# Patient Record
Sex: Female | Born: 1955 | Race: White | Hispanic: No | Marital: Married | Smoking: Former smoker
Health system: Southern US, Community
[De-identification: ages and names within clinical notes are randomized; demographics above are authoritative.]

## PROBLEM LIST (undated history)

## (undated) DIAGNOSIS — J302 Other seasonal allergic rhinitis: Secondary | ICD-10-CM

## (undated) DIAGNOSIS — C3490 Malignant neoplasm of unspecified part of unspecified bronchus or lung: Secondary | ICD-10-CM

## (undated) DIAGNOSIS — J939 Pneumothorax, unspecified: Secondary | ICD-10-CM

## (undated) DIAGNOSIS — M1990 Unspecified osteoarthritis, unspecified site: Secondary | ICD-10-CM

## (undated) HISTORY — PX: ORIF ANKLE FRACTURE: SUR919

## (undated) HISTORY — PX: SPINE SURGERY: SHX786

## (undated) HISTORY — DX: Pneumothorax, unspecified: J93.9

## (undated) HISTORY — PX: TUBAL LIGATION: SHX77

## (undated) HISTORY — DX: Unspecified osteoarthritis, unspecified site: M19.90

## (undated) HISTORY — DX: Other seasonal allergic rhinitis: J30.2

---

## 2015-01-03 ENCOUNTER — Other Ambulatory Visit: Payer: Self-pay | Admitting: *Deleted

## 2015-01-03 ENCOUNTER — Encounter: Payer: Self-pay | Admitting: *Deleted

## 2015-01-03 DIAGNOSIS — R918 Other nonspecific abnormal finding of lung field: Secondary | ICD-10-CM | POA: Insufficient documentation

## 2015-01-03 NOTE — [facility] Oncology Navigator Note (Unsigned)
I received a phone call from Judith Gilbert son requesting an appt with Dr. Julien Nordmann for a second opinion.  I received records on 12/31/14.  I reviewed and called patient with appt.  She verbalized understanding of appt time and place.

## 2015-01-11 ENCOUNTER — Other Ambulatory Visit: Payer: Self-pay | Admitting: Internal Medicine

## 2015-01-12 ENCOUNTER — Other Ambulatory Visit (HOSPITAL_BASED_OUTPATIENT_CLINIC_OR_DEPARTMENT_OTHER): Payer: BLUE CROSS/BLUE SHIELD

## 2015-01-12 ENCOUNTER — Ambulatory Visit (HOSPITAL_BASED_OUTPATIENT_CLINIC_OR_DEPARTMENT_OTHER): Payer: BLUE CROSS/BLUE SHIELD | Admitting: Internal Medicine

## 2015-01-12 ENCOUNTER — Telehealth: Payer: Self-pay | Admitting: *Deleted

## 2015-01-12 ENCOUNTER — Encounter: Payer: Self-pay | Admitting: Internal Medicine

## 2015-01-12 ENCOUNTER — Telehealth: Payer: Self-pay | Admitting: Medical Oncology

## 2015-01-12 ENCOUNTER — Telehealth: Payer: Self-pay | Admitting: Internal Medicine

## 2015-01-12 VITALS — BP 120/67 | HR 83 | Temp 98.4°F | Resp 18 | Ht 65.0 in | Wt 174.3 lb

## 2015-01-12 DIAGNOSIS — C3491 Malignant neoplasm of unspecified part of right bronchus or lung: Secondary | ICD-10-CM

## 2015-01-12 DIAGNOSIS — C3411 Malignant neoplasm of upper lobe, right bronchus or lung: Secondary | ICD-10-CM

## 2015-01-12 DIAGNOSIS — R0609 Other forms of dyspnea: Secondary | ICD-10-CM

## 2015-01-12 DIAGNOSIS — M25511 Pain in right shoulder: Secondary | ICD-10-CM

## 2015-01-12 DIAGNOSIS — R918 Other nonspecific abnormal finding of lung field: Secondary | ICD-10-CM

## 2015-01-12 LAB — COMPREHENSIVE METABOLIC PANEL (CC13)
ALT: 10 U/L (ref 0–55)
ANION GAP: 13 meq/L — AB (ref 3–11)
AST: 13 U/L (ref 5–34)
Albumin: 3.5 g/dL (ref 3.5–5.0)
Alkaline Phosphatase: 125 U/L (ref 40–150)
BILIRUBIN TOTAL: 0.23 mg/dL (ref 0.20–1.20)
BUN: 8.8 mg/dL (ref 7.0–26.0)
CO2: 27 mEq/L (ref 22–29)
CREATININE: 0.8 mg/dL (ref 0.6–1.1)
Calcium: 9.4 mg/dL (ref 8.4–10.4)
Chloride: 104 mEq/L (ref 98–109)
EGFR: 85 mL/min/{1.73_m2} — ABNORMAL LOW (ref >90)
Glucose: 88 mg/dl (ref 70–140)
Potassium: 4 mEq/L (ref 3.5–5.1)
Sodium: 144 mEq/L (ref 136–145)
Total Protein: 7.6 g/dL (ref 6.4–8.3)

## 2015-01-12 LAB — CBC WITH DIFFERENTIAL/PLATELET
BASO%: 0.6 % (ref 0.0–2.0)
Basophils Absolute: 0 10*3/uL (ref 0.0–0.1)
EOS ABS: 0 10*3/uL (ref 0.0–0.5)
EOS%: 1.1 % (ref 0.0–7.0)
HCT: 33.3 % — ABNORMAL LOW (ref 34.8–46.6)
HGB: 10.8 g/dL — ABNORMAL LOW (ref 11.6–15.9)
LYMPH#: 0.6 10*3/uL — AB (ref 0.9–3.3)
LYMPH%: 15.6 % (ref 14.0–49.7)
MCH: 28.6 pg (ref 25.1–34.0)
MCHC: 32.5 g/dL (ref 31.5–36.0)
MCV: 87.9 fL (ref 79.5–101.0)
MONO#: 0.4 10*3/uL (ref 0.1–0.9)
MONO%: 10.6 % (ref 0.0–14.0)
NEUT%: 72.1 % (ref 38.4–76.8)
NEUTROS ABS: 2.9 10*3/uL (ref 1.5–6.5)
PLATELETS: 345 10*3/uL (ref 145–400)
RBC: 3.79 10*6/uL (ref 3.70–5.45)
RDW: 20.6 % — ABNORMAL HIGH (ref 11.2–14.5)
WBC: 4 10*3/uL (ref 3.9–10.3)

## 2015-01-12 MED ORDER — CYANOCOBALAMIN 1000 MCG/ML IJ SOLN
INTRAMUSCULAR | Status: AC
  Filled 2015-01-12: qty 1

## 2015-01-12 MED ORDER — FOLIC ACID 1 MG PO TABS: 1.0000 mg | ORAL_TABLET | Freq: Every day | ORAL | Status: DC

## 2015-01-12 MED ORDER — CYANOCOBALAMIN 1000 MCG/ML IJ SOLN
1000.0000 ug | Freq: Once | INTRAMUSCULAR | Status: AC
  Administered 2015-01-12: 1000 ug via INTRAMUSCULAR

## 2015-01-12 MED ORDER — DEXAMETHASONE 4 MG PO TABS: ORAL_TABLET | ORAL | Status: DC

## 2015-01-12 MED ORDER — PROCHLORPERAZINE MALEATE 10 MG PO TABS: 10.0000 mg | ORAL_TABLET | Freq: Four times a day (QID) | ORAL | Status: DC | PRN

## 2015-01-12 NOTE — Telephone Encounter (Signed)
Per staff message and POF I have scheduled appts. Advised scheduler of appts. JMW  

## 2015-01-12 NOTE — Telephone Encounter (Signed)
erronoeus

## 2015-01-12 NOTE — Progress Notes (Signed)
[facility] Telephone:([phone]   Fax:([phone]) [phone]  CONSULT NOTE  REFERRING PHYSICIAN: Dr. Debbe Bales  REASON FOR CONSULTATION:  59 years old white female recently diagnosed with lung cancer.  HPI Judith Gilbert is a 59 y.o. female a never smoker from [place], [place] with no significant past medical history except for bone fractures surgery after an accident several years ago. The patient mentioned that in October 2015 she has been complaining of pain on the right shoulder and right scapula. She was seen by her orthopedic surgeon and she had MRI of the cervical spine performed on 07/30/2014 and it showed a finding worrisome for a mass in the lung apex. This was followed by CT scan of the chest as well as PET scan on 08/27/2014 and it showed hypermetabolic mass in the right apex with invasion of the mediastinum consistent with malignancy in addition to hypermetabolic right paratracheal lymph node also consistent with malignancy. The previously noted right hilar lymph nodes demonstrate no suspicious activity. There was no abnormal hypermetabolic adenopathy in the contralateral mediastinum or hilum. There was no suspicious activity throughout the neck, abdomen or pelvis and no suspicious osseous activity. The patient initially underwent bronchoscopy with transbronchial fine needle aspiration of the 4R lymph node as well as the 10 R lymph node which showed atypical cells suspicious for adenocarcinoma. The patient then underwent mediastinoscopy by Dr. Juleen China and the biopsy of the 4R lymph node was positive for adenocarcinoma but level 7 lymph node was negative. The tissue blocks were sent for routine molecular studies and it showed negative EGFR mutation, negative ALK gene translocation as well as negative ROS 1.  MRI of the brain showed no evidence for metastatic disease to the brain. The patient was seen by Dr. Ovid Gilbert, medical oncology as well as Dr. Avis Epley, radiation oncology.  She was started on a course of concurrent chemoradiation with weekly carboplatin and paclitaxel for 6 weeks and total dose of radiation 60 GY completed the first week of February 2016. The patient tolerated her treatment fairly well. She had repeat CT scan of the chest performed on 12/20/2014 which showed stable 2.0 CML enlarged right paratracheal lymph node and a stable right apical mass measuring 4.2 x 5.6 x 4.1 cm with no significant change from the prior study. There was no new lesions or other enlarged mediastinal lymph nodes. The patient is currently monitored by observation and expected to have repeat CT scan of the chest on 02/28/2015. She did not receive any consolidation chemotherapy. She came today for evaluation and second opinion regarding her condition.  She is feeling fine today with no specific complaints except for mild right shoulder pain which has significantly improved compared after the course of concurrent chemoradiation. She has shortness breath with exertion. She denied having any significant cough or hemoptysis. The patient denied having any significant weight loss or night sweats. She has no nausea or vomiting or change in her bowel movement.  PAST MEDICAL HISTORY: Significant only for recently diagnosed stage IIIB non-small cell lung cancer, adenocarcinoma, history of bone surgery after accident, total abdominal hysterectomy. The patient denied having any history of diabetes mellitus, hypertension, coronary R disease or stroke.   FAMILY HISTORY: significant for mother with esophageal cancer and father died from alcoholic complications.   SOCIAL HISTORY: The patient is married and has 3 children. She was accompanied today by her husband Judith Gilbert. She is currently retired. She is almost a never smoker as the patient smoked only one  pack of cigarette for 3 years and quit in 1981. No history of alcohol or drug abuse.   HPI  Social History History  Substance Use Topics  . Smoking  status: Former Smoker -- 1.00 packs/day for 4 years    Quit date: 01/12/1980  . Smokeless tobacco: Not on file  . Alcohol Use: Not on file    Allergies  Allergen Reactions  . Percocet [Oxycodone-Acetaminophen] Rash    Current Outpatient Prescriptions  Medication Sig Dispense Refill  . montelukast (SINGULAIR) 10 MG tablet Take 10 mg by mouth at bedtime.    Marland Kitchen venlafaxine (EFFEXOR) 75 MG tablet Take 75 mg by mouth daily.    Marland Kitchen dexamethasone (DECADRON) 4 MG tablet 4 mg by mouth twice a day the day before, day of and day after the chemotherapy every 3 weeks. 40 tablet 0  . folic acid (FOLVITE) 1 MG tablet Take 1 tablet (1 mg total) by mouth daily. 30 tablet 2  . prochlorperazine (COMPAZINE) 10 MG tablet Take 1 tablet (10 mg total) by mouth every 6 (six) hours as needed for nausea or vomiting. 30 tablet 0   No current facility-administered medications for this visit.    Review of Systems  Constitutional: negative Eyes: negative Ears, nose, mouth, throat, and face: negative Respiratory: positive for dyspnea on exertion Cardiovascular: negative Gastrointestinal: negative Genitourinary:negative Integument/breast: negative Hematologic/lymphatic: negative Musculoskeletal:positive for Mild right shoulder pain. Neurological: negative Behavioral/Psych: negative Endocrine: negative Allergic/Immunologic: negative  Physical Exam  WKM:QKMMN, healthy, no distress, well nourished and well developed SKIN: skin color, texture, turgor are normal, no rashes or significant lesions HEAD: Normocephalic, No masses, lesions, tenderness or abnormalities EYES: normal, PERRLA EARS: External ears normal, Canals clear OROPHARYNX:no exudate, no erythema and lips, buccal mucosa, and tongue normal  NECK: supple, no adenopathy, no JVD LYMPH:  no palpable lymphadenopathy, no hepatosplenomegaly BREAST:not examined LUNGS: clear to auscultation , and palpation HEART: regular rate & rhythm, no murmurs and no  gallops ABDOMEN:abdomen soft, non-tender, normal bowel sounds and no masses or organomegaly BACK: Back symmetric, no curvature., No CVA tenderness EXTREMITIES:no joint deformities, effusion, or inflammation, no edema, no skin discoloration, no clubbing  NEURO: alert & oriented x 3 with fluent speech, no focal motor/sensory deficits  PERFORMANCE STATUS: ECOG 1  LABORATORY DATA: Lab Results  Component Value Date   WBC 4.0 01/12/2015   HGB 10.8* 01/12/2015   HCT 33.3* 01/12/2015   MCV 87.9 01/12/2015   PLT 345 01/12/2015      Chemistry      Component Value Date/Time   NA 144 01/12/2015 1119   K 4.0 01/12/2015 1119   CO2 27 01/12/2015 1119   BUN 8.8 01/12/2015 1119   CREATININE 0.8 01/12/2015 1119      Component Value Date/Time   CALCIUM 9.4 01/12/2015 1119   ALKPHOS 125 01/12/2015 1119   AST 13 01/12/2015 1119   ALT 10 01/12/2015 1119   BILITOT 0.23 01/12/2015 1119       RADIOGRAPHIC STUDIES: No results found.  ASSESSMENT: This is a very pleasant 59 years old never smoker white female recently diagnosed with a stage IIIB (T4, N2, M0) non-small cell lung cancer with negative EGFR mutation, negative ALK gene translocation and negative ROS 1 gene rearrangement diagnosed in November 2015. The patient is status post a course of concurrent chemoradiation with weekly carboplatin and paclitaxel with no significant change in her disease on his recent imaging studies.   PLAN: I had a lengthy discussion with the patient and her husband  today about her current disease stage, prognosis and treatment options. I explained to the patient and her husband that she received to standard treatment for her current stage of the disease with a course of concurrent chemoradiation. I also explained to the patient and her husband that the only change I would offer at this point is consideration of 3 cycles of consolidation chemotherapy because of the high risk of systemic disease recurrence in  patient with a stage IIIB especially if the concurrent chemoradiation was with weekly carboplatin and paclitaxel based on a recent ECOG clinical trial which showed survival benefit for concurrent chemoradiation with weekly carboplatin and paclitaxel followed by 2-3 cycles of consolidation chemotherapy. The patient and her husband are interested in considering the course of consolidation chemotherapy. I recommended for her a regimen consisting of carboplatin for AUC of 5 and Alimta 500 MG/M2 every 3 weeks for 3 cycles. I discussed with the patient and her husband the adverse effect of this treatment including but not limited to mild alopecia, myelosuppression, nausea and vomiting, peripheral neuropathy, liver or renal dysfunction. The patient is expected to start the first cycle of her systemic chemotherapy in 2 weeks. She'll receive vitamin B 12 injection today. I will also call her pharmacy with prescription for Compazine 10 mg by mouth every 6 hours as needed for nausea, folic acid 1 mg by mouth daily in addition to Decadron 4 mg by mouth twice a day the day before, day of and day after the chemotherapy every 3 weeks. I will repeat a PET scan and he is in 2 weeks for reevaluation of her disease before starting the first cycle of consolidation chemotherapy. I also discussed with the patient sending her tissue block to [facility] for extensive molecular studies especially with her non-smoker status as her tumor may harbor other mutation-like be BRAF, RET, MET or ERBB2. which are potential target for treatment if the patient has evidence for disease progression. She will discuss with Dr. Ovid Gilbert sending the tissue block for the second generation sequencing. If there is insufficient tissue, I may consider sending the blood test to [facility] for molecular study. The patient would come back for follow-up visit in 2 weeks for reevaluation before starting the first cycle of her treatment. She was advised  to call immediately if she has any concerning symptoms in the interval. The patient voices understanding of current disease status and treatment options and is in agreement with the current care plan.  All questions were answered. The patient knows to call the clinic with any problems, questions or concerns. We can certainly see the patient much sooner if necessary.  Thank you so much for allowing me to participate in the care of [employer] D[employer]. I will continue to follow up the patient with you and assist in her care.  I spent 55 minutes counseling the patient face to face. The total time spent in the appointment was 80 minutes.  Disclaimer: This note was dictated with voice recognition software. Similar sounding words can inadvertently be transcribed and may not be corrected upon review.   Kirt Chew K. January 12, 2015, 1:20 PM

## 2015-01-12 NOTE — Telephone Encounter (Signed)
Pt confirmed labs/ov per 03/30 POF, gave pt AVS and Calendar.... KJ, sent msg to add chemo and pt declined Chemo Educations states she has already had these classes, sent msg to MD confirming.Marland KitchenMarland KitchenMarland Kitchen

## 2015-01-14 ENCOUNTER — Telehealth: Payer: Self-pay | Admitting: *Deleted

## 2015-01-14 NOTE — Telephone Encounter (Signed)
I have adjusted 4/13

## 2015-01-14 NOTE — Telephone Encounter (Signed)
Patient called wanting to know if PET scan needs to be done before starting chemo or can it be done later (scheduled for 7:00 4/13). Also scheduled for labs on 4/6, can these labs be done up in [place]?  Patient also contacted [facility] in reference to tissue samples to [facility]. Spoke with Clarise Cruz [phone]   This message sent to Dr. Julien Nordmann and desk nurse.

## 2015-01-14 NOTE — Telephone Encounter (Signed)
Called pt and will leave note to [facility]

## 2015-01-15 NOTE — Telephone Encounter (Signed)
I will check with Radiology to see if we can do the PET scan earlier before starting chemo. No need for lab on 4/6. She can do it first day of chemo. Judith Gilbert, Would you please see if we can mover her PET earlier. She comes from [place], [place]. Thank you.

## 2015-01-17 ENCOUNTER — Telehealth: Payer: Self-pay | Admitting: Internal Medicine

## 2015-01-17 ENCOUNTER — Telehealth: Payer: Self-pay | Admitting: *Deleted

## 2015-01-17 NOTE — Telephone Encounter (Signed)
PER DR.MOHAMED'S NOTE ON 01/15/15 PT. DOES NOT NEED LAB ON 01/19/15. NOTIFIED PT. ALSO [facility] PHONE NUMBER TO CLARIFY PET INSTRUCTIONS.

## 2015-01-17 NOTE — Telephone Encounter (Signed)
returned call and confirmed with pt 4.6 appt cx ok with MD and neilsa

## 2015-01-18 ENCOUNTER — Encounter: Payer: Self-pay | Admitting: *Deleted

## 2015-01-18 NOTE — [facility] Oncology Navigator Note (Unsigned)
Patient left vm message to call her.  I called her back and left a vm message with my name and phone number to call if needed.

## 2015-01-19 ENCOUNTER — Telehealth: Payer: Self-pay | Admitting: Internal Medicine

## 2015-01-19 ENCOUNTER — Telehealth: Payer: Self-pay | Admitting: *Deleted

## 2015-01-19 ENCOUNTER — Other Ambulatory Visit: Payer: BLUE CROSS/BLUE SHIELD

## 2015-01-19 ENCOUNTER — Other Ambulatory Visit: Payer: Self-pay | Admitting: Internal Medicine

## 2015-01-19 MED ORDER — LORAZEPAM 0.5 MG PO TABS: 0.5000 mg | ORAL_TABLET | Freq: Three times a day (TID) | ORAL | Status: DC | PRN

## 2015-01-19 NOTE — Telephone Encounter (Signed)
Spoke with Dr. Julien Nordmann regarding request for medication for anxiety.  He ordered.  I called pharmacy in [place] with order.  I called patient to notify her of medication.  She was thankful for the follow up.

## 2015-01-19 NOTE — Telephone Encounter (Signed)
Moved labs before flush and closer to MD visit to give time for scan.... Mailed out schedule and lft msg... KJ

## 2015-01-19 NOTE — Telephone Encounter (Signed)
Patient called left me a vm message to call.  I called her back.  She states she needs medication for PET scan and before chemotherapy.  I will update Dr. Julien Nordmann.

## 2015-01-26 ENCOUNTER — Ambulatory Visit (HOSPITAL_BASED_OUTPATIENT_CLINIC_OR_DEPARTMENT_OTHER): Payer: BLUE CROSS/BLUE SHIELD

## 2015-01-26 ENCOUNTER — Encounter (HOSPITAL_COMMUNITY)
Admission: RE | Admit: 2015-01-26 | Discharge: 2015-01-26 | Disposition: A | Discharge status: Home / Self Care | Payer: BLUE CROSS/BLUE SHIELD | Source: Ambulatory Visit | Attending: Internal Medicine | Admitting: Internal Medicine

## 2015-01-26 ENCOUNTER — Telehealth: Payer: Self-pay | Admitting: *Deleted

## 2015-01-26 ENCOUNTER — Ambulatory Visit (HOSPITAL_BASED_OUTPATIENT_CLINIC_OR_DEPARTMENT_OTHER): Payer: BLUE CROSS/BLUE SHIELD | Admitting: Physician Assistant

## 2015-01-26 ENCOUNTER — Telehealth: Payer: Self-pay | Admitting: Physician Assistant

## 2015-01-26 ENCOUNTER — Encounter: Payer: Self-pay | Admitting: *Deleted

## 2015-01-26 ENCOUNTER — Other Ambulatory Visit: Payer: Self-pay | Admitting: Medical Oncology

## 2015-01-26 ENCOUNTER — Other Ambulatory Visit: Payer: BLUE CROSS/BLUE SHIELD

## 2015-01-26 ENCOUNTER — Other Ambulatory Visit (HOSPITAL_BASED_OUTPATIENT_CLINIC_OR_DEPARTMENT_OTHER): Payer: BLUE CROSS/BLUE SHIELD

## 2015-01-26 ENCOUNTER — Encounter: Payer: Self-pay | Admitting: Physician Assistant

## 2015-01-26 VITALS — BP 127/76 | HR 75 | Temp 97.8°F | Resp 19 | Ht 65.0 in | Wt 174.6 lb

## 2015-01-26 DIAGNOSIS — M2550 Pain in unspecified joint: Secondary | ICD-10-CM

## 2015-01-26 DIAGNOSIS — Z5111 Encounter for antineoplastic chemotherapy: Secondary | ICD-10-CM | POA: Diagnosis not present

## 2015-01-26 DIAGNOSIS — C3411 Malignant neoplasm of upper lobe, right bronchus or lung: Secondary | ICD-10-CM

## 2015-01-26 DIAGNOSIS — Z923 Personal history of irradiation: Secondary | ICD-10-CM | POA: Insufficient documentation

## 2015-01-26 DIAGNOSIS — F419 Anxiety disorder, unspecified: Secondary | ICD-10-CM

## 2015-01-26 DIAGNOSIS — R110 Nausea: Secondary | ICD-10-CM | POA: Diagnosis not present

## 2015-01-26 DIAGNOSIS — R5382 Chronic fatigue, unspecified: Secondary | ICD-10-CM

## 2015-01-26 DIAGNOSIS — Z9221 Personal history of antineoplastic chemotherapy: Secondary | ICD-10-CM | POA: Diagnosis not present

## 2015-01-26 DIAGNOSIS — R918 Other nonspecific abnormal finding of lung field: Secondary | ICD-10-CM

## 2015-01-26 LAB — CBC WITH DIFFERENTIAL/PLATELET
BASO%: 0 % (ref 0.0–2.0)
Basophils Absolute: 0 10*3/uL (ref 0.0–0.1)
EOS%: 0 % (ref 0.0–7.0)
Eosinophils Absolute: 0 10*3/uL (ref 0.0–0.5)
HEMATOCRIT: 35.2 % (ref 34.8–46.6)
HGB: 11.5 g/dL — ABNORMAL LOW (ref 11.6–15.9)
LYMPH%: 4.8 % — ABNORMAL LOW (ref 14.0–49.7)
MCH: 29 pg (ref 25.1–34.0)
MCHC: 32.7 g/dL (ref 31.5–36.0)
MCV: 88.9 fL (ref 79.5–101.0)
MONO#: 0.5 10*3/uL (ref 0.1–0.9)
MONO%: 5 % (ref 0.0–14.0)
NEUT#: 9.3 10*3/uL — ABNORMAL HIGH (ref 1.5–6.5)
NEUT%: 90.2 % — AB (ref 38.4–76.8)
Platelets: 359 10*3/uL (ref 145–400)
RBC: 3.96 10*6/uL (ref 3.70–5.45)
RDW: 17 % — AB (ref 11.2–14.5)
WBC: 10.3 10*3/uL (ref 3.9–10.3)
lymph#: 0.5 10*3/uL — ABNORMAL LOW (ref 0.9–3.3)

## 2015-01-26 LAB — COMPREHENSIVE METABOLIC PANEL (CC13)
ALBUMIN: 3.7 g/dL (ref 3.5–5.0)
ALK PHOS: 148 U/L (ref 40–150)
ALT: 11 U/L (ref 0–55)
AST: 11 U/L (ref 5–34)
Anion Gap: 15 mEq/L — ABNORMAL HIGH (ref 3–11)
BUN: 13.5 mg/dL (ref 7.0–26.0)
CO2: 22 mEq/L (ref 22–29)
Calcium: 9.7 mg/dL (ref 8.4–10.4)
Chloride: 106 mEq/L (ref 98–109)
Creatinine: 0.7 mg/dL (ref 0.6–1.1)
EGFR: 89 mL/min/{1.73_m2} — ABNORMAL LOW (ref >90)
Glucose: 117 mg/dl (ref 70–140)
POTASSIUM: 3.9 meq/L (ref 3.5–5.1)
SODIUM: 142 meq/L (ref 136–145)
TOTAL PROTEIN: 8.1 g/dL (ref 6.4–8.3)
Total Bilirubin: 0.21 mg/dL (ref 0.20–1.20)

## 2015-01-26 LAB — GLUCOSE, CAPILLARY: Glucose-Capillary: 120 mg/dL — ABNORMAL HIGH (ref 70–99)

## 2015-01-26 MED ORDER — HEPARIN SOD (PORK) LOCK FLUSH 100 UNIT/ML IV SOLN
500.0000 [IU] | Freq: Once | INTRAVENOUS | Status: AC | PRN
  Administered 2015-01-26: 500 [IU]
  Filled 2015-01-26: qty 5

## 2015-01-26 MED ORDER — SODIUM CHLORIDE 0.9 % IV SOLN
592.0000 mg | Freq: Once | INTRAVENOUS | Status: AC
  Administered 2015-01-26: 590 mg via INTRAVENOUS
  Filled 2015-01-26: qty 59

## 2015-01-26 MED ORDER — SODIUM CHLORIDE 0.9 % IV SOLN
Freq: Once | INTRAVENOUS | Status: AC
  Administered 2015-01-26: 13:00:00 via INTRAVENOUS
  Filled 2015-01-26: qty 8

## 2015-01-26 MED ORDER — SODIUM CHLORIDE 0.9 % IV SOLN
Freq: Once | INTRAVENOUS | Status: AC
  Administered 2015-01-26: 13:00:00 via INTRAVENOUS

## 2015-01-26 MED ORDER — FLUDEOXYGLUCOSE F - 18 (FDG) INJECTION
8.4800 | Freq: Once | INTRAVENOUS | Status: AC | PRN
  Administered 2015-01-26: 8.48 via INTRAVENOUS

## 2015-01-26 MED ORDER — OLANZAPINE 10 MG PO TABS: 10.0000 mg | ORAL_TABLET | Freq: Every day | ORAL | Status: DC

## 2015-01-26 MED ORDER — SODIUM CHLORIDE 0.9 % IV SOLN
500.0000 mg/m2 | Freq: Once | INTRAVENOUS | Status: AC
  Administered 2015-01-26: 950 mg via INTRAVENOUS
  Filled 2015-01-26: qty 38

## 2015-01-26 MED ORDER — SODIUM CHLORIDE 0.9 % IJ SOLN
10.0000 mL | INTRAMUSCULAR | Status: DC | PRN
  Administered 2015-01-26: 10 mL
  Filled 2015-01-26: qty 10

## 2015-01-26 NOTE — [facility] Oncology Navigator Note (Unsigned)
Spoke with patient today with Dr. Julien Nordmann. Patient's tissue needs to [facility] sent [facility].  I called [facility] and left a vm message with Estill Bamberg asking she call me so I can help facilitate.

## 2015-01-26 NOTE — Telephone Encounter (Signed)
Per staff message and POF I have scheduled appts. Advised scheduler of appts. JMW  

## 2015-01-26 NOTE — Telephone Encounter (Signed)
Pt confirmed labs/ov per 04/13 POF, gave pt AVS and Calendar..... KJ, sent msg to add chemo

## 2015-01-26 NOTE — Telephone Encounter (Signed)
Opal Sidles calling asking if we have 2015 PET CD for this patient.  PET today reads to compare with PET from November 2015. Opal Sidles reports techs asked patient for CD and was told they did not have a CD.  Dr. Julien Nordmann notified.  Patient and spouse were instructed to give CD to techs.  Instructed to read today's PET and when they bring CD to our office then can compare.

## 2015-01-26 NOTE — Patient Instructions (Addendum)
[facility] Discharge Instructions for Patients Receiving Chemotherapy  Today you received the following chemotherapy agents Carboplatin, Alimta.  To help prevent nausea and vomiting after your treatment, we encourage you to take your nausea medication as prescribed by your physician. If you develop nausea and vomiting that is not controlled by your nausea medication, call the clinic.   BELOW ARE SYMPTOMS THAT SHOULD BE REPORTED IMMEDIATELY:  *FEVER GREATER THAN 100.5 F  *CHILLS WITH OR WITHOUT FEVER  NAUSEA AND VOMITING THAT IS NOT CONTROLLED WITH YOUR NAUSEA MEDICATION  *UNUSUAL SHORTNESS OF BREATH  *UNUSUAL BRUISING OR BLEEDING  TENDERNESS IN MOUTH AND THROAT WITH OR WITHOUT PRESENCE OF ULCERS  *URINARY PROBLEMS  *BOWEL PROBLEMS  UNUSUAL RASH Items with * indicate a potential emergency and should be followed up as soon as possible.  Feel free to call the clinic you have any questions or concerns. The clinic phone number is [phone].  Please show the [facility] [place] at check-in to the Emergency Department and triage nurse.

## 2015-01-26 NOTE — [facility] Oncology Navigator Note (Unsigned)
[place] at [phone].  I spoke with pathology dept and stated request for tissue to be sent to [facility].  I faxed requisition form and insurance information per request to pathology dept at [facility].  Confirmation fax received.

## 2015-01-26 NOTE — Progress Notes (Addendum)
No images are attached to the encounter. No scans are attached to the encounter. No scans are attached to the encounter. [place] SHARED VISIT PROGRESS NOTE  [facility], Dani Gobble, NP [id number] [facility] [place] [address]  DIAGNOSIS: Cancer of upper lobe of right lung   Staging form: Lung, AJCC 7th Edition     Clinical stage from 01/12/2015: Stage IIIB (T4, N2, M0) - Signed by Curt Bears, MD on 01/12/2015       Staging comments: Adenocarcinoma  Molecular studies were negative for EGFR, ALK gene translocation as well as ROS 1. Her tissue is to [facility] for [facility] for more extensive molecular marker  determination  PRIOR THERAPY: A course of concurrent chemoradiation completed in the [facility], [place] area  CURRENT THERAPY: Consolidation chemotherapy with carboplatin for an AUC of 5 and Alimta at 500 mg/m given every 3 weeks. First cycle expected 01/26/2015  INTERVAL HISTORY: Judith Gilbert 59 y.o. female returns for a scheduled regular office visit for followup of her stage III B non small cell lung cancer. She had a PET scan earlier this morning to restage her disease.  She reports that she held her gabapentin to better gauge/rate her pain. She feels the gabapentin does a good job of controlling her pain. She reports that she had a difficult time with nausea and vomiting during her course of concurrent chemoradiation. She was given a prescription for lorazepam 0.5 mg to take for anxiety. She states she took 1 tablet but did not feel as though it helped her relax very much. Today she complains of being tired, she's had a very early day driving in from [place] to have her PET scan before starting her first cycle of consolidation chemotherapy with carboplatin and Alimta. Unfortunately she and her husband forgot the disc with her imaging scans from [place] for comparison for our radiology department.   MEDICAL HISTORY:History reviewed. No pertinent  past medical history.  ALLERGIES:  is allergic to percocet.  MEDICATIONS:  Current Outpatient Prescriptions  Medication Sig Dispense Refill  . dexamethasone (DECADRON) 4 MG tablet 4 mg by mouth twice a day the day before, day of and day after the chemotherapy every 3 weeks. 40 tablet 0  . folic acid (FOLVITE) 1 MG tablet Take 1 tablet (1 mg total) by mouth daily. 30 tablet 2  . LORazepam (ATIVAN) 0.5 MG tablet Take 1 tablet (0.5 mg total) by mouth every 8 (eight) hours as needed for anxiety (Take one tab before scan.). 30 tablet 0  . montelukast (SINGULAIR) 10 MG tablet Take 10 mg by mouth at bedtime.    . prochlorperazine (COMPAZINE) 10 MG tablet Take 1 tablet (10 mg total) by mouth every 6 (six) hours as needed for nausea or vomiting. 30 tablet 0  . venlafaxine (EFFEXOR) 75 MG tablet Take 75 mg by mouth daily.    Marland Kitchen OLANZapine (ZYPREXA) 10 MG tablet Take 1 tablet (10 mg total) by mouth at bedtime. 30 tablet 0   No current facility-administered medications for this visit.   Facility-Administered Medications Ordered in Other Visits  Medication Dose Route Frequency Provider Last Rate Last Dose  . sodium chloride 0.9 % injection 10 mL  10 mL Intracatheter PRN Curt Bears, MD   10 mL at 01/26/15 1509    SURGICAL HISTORY: History reviewed. No pertinent past surgical history.  REVIEW OF SYSTEMS:  Review of Systems  Constitutional: Positive for malaise/fatigue. Negative for fever, chills, weight loss and diaphoresis.  HENT: Negative for congestion, ear discharge, ear pain, hearing loss, nosebleeds, sore throat and tinnitus.   Eyes: Negative for blurred vision, double vision, photophobia, pain, discharge and redness.  Respiratory: Negative for cough, hemoptysis, sputum production, shortness of breath, wheezing and stridor.   Cardiovascular: Negative for chest pain, palpitations, orthopnea, claudication, leg swelling and PND.  Gastrointestinal: Positive for nausea. Negative for heartburn,  vomiting, abdominal pain, diarrhea, constipation, blood in stool and melena.  Genitourinary: Negative.   Musculoskeletal: Positive for joint pain.       Right shoulder pain, described as a "5" on a "0-10" scale  Skin: Negative.   Neurological: Negative for dizziness, tingling, focal weakness, seizures, weakness and headaches.  Endo/Heme/Allergies: Does not bruise/bleed easily.  Psychiatric/Behavioral: Negative for depression. The patient is nervous/anxious. The patient does not have insomnia.      PHYSICAL EXAMINATION: Physical Exam  Constitutional: She is oriented to person, place, and time and well-developed, well-nourished, and in no distress.  HENT:  Head: Normocephalic and atraumatic.  Mouth/Throat: Oropharynx is clear and moist.  Eyes: Pupils are equal, round, and reactive to light.  Neck: Normal range of motion. Neck supple. No JVD present. No tracheal deviation present. No thyromegaly present.  Cardiovascular: Normal rate, regular rhythm, normal heart sounds and intact distal pulses.  Exam reveals no gallop and no friction rub.   No murmur heard. Pulmonary/Chest: Effort normal and breath sounds normal. No respiratory distress. She has no wheezes. She has no rales.  Abdominal: Soft. Bowel sounds are normal. She exhibits no distension and no mass. There is no tenderness.  Musculoskeletal: Normal range of motion. She exhibits no edema or tenderness.  Lymphadenopathy:    She has no cervical adenopathy.  Neurological: She is alert and oriented to person, place, and time. She has normal reflexes. Gait normal.  Skin: Skin is warm and dry. No rash noted.    ECOG PERFORMANCE STATUS: 1 - Symptomatic but completely ambulatory  Blood pressure 127/76, pulse 75, temperature 97.8 F (36.6 C), temperature source Oral, resp. rate 19, height _0  (1.651 m), weight 174 lb 9.6 oz (79.198 kg), SpO2 99 %.  LABORATORY DATA: Lab Results  Component Value Date   WBC 10.3 01/26/2015   HGB 11.5*  01/26/2015   HCT 35.2 01/26/2015   MCV 88.9 01/26/2015   PLT 359 01/26/2015      Chemistry      Component Value Date/Time   NA 142 01/26/2015 0826   K 3.9 01/26/2015 0826   CO2 22 01/26/2015 0826   BUN 13.5 01/26/2015 0826   CREATININE 0.7 01/26/2015 0826      Component Value Date/Time   CALCIUM 9.7 01/26/2015 0826   ALKPHOS 148 01/26/2015 0826   AST 11 01/26/2015 0826   ALT 11 01/26/2015 0826   BILITOT 0.21 01/26/2015 0826       RADIOGRAPHIC STUDIES:  Nm Pet Image Initial (pi) Skull Base To Thigh  01/26/2015   CLINICAL DATA:  Subsequent treatment strategy for lung cancer. Chemotherapy and radiation therapy completed. Subsequent encounter.  EXAM: NUCLEAR MEDICINE PET SKULL BASE TO THIGH  TECHNIQUE: 8.48 mCi F-18 FDG was injected intravenously. Full-ring PET imaging was performed from the skull base to thigh after the radiotracer. CT data was obtained and used for attenuation correction and anatomic localization.  FASTING BLOOD GLUCOSE:  Value: 120 mg/dl  COMPARISON:  None available.  FINDINGS: NECK  No hypermetabolic cervical lymph nodes are identified.There are no lesions of the pharyngeal mucosal space. There is physiologic activity  associated with the muscles of phonation. 9 mm right thyroid nodule on image 47 does not show any increased metabolic activity.  CHEST  Well-circumscribed right apical mass measures 5.0 x 3.4 cm and is significantly hypermetabolic with an SUV max of 12.9. This lesion demonstrates no significant central necrosis. Pleural extension of this lesion cannot be excluded, although there is no rib destruction. A spiculated right upper lobe nodule measuring 1.5 cm on image 16 is also hypermetabolic for size with an SUV max of 3.5. No other suspicious pulmonary findings. There is a hypermetabolic right paratracheal node measuring 1.5 cm short axis on image 61. This has an SUV max of 7.2. No other hypermetabolic nodal activity identified. Left IJ Port-A-Cath tip in the  lower SVC near the SVC right atrial junction.  ABDOMEN/PELVIS  There is no hypermetabolic activity within the liver, adrenal glands, spleen or pancreas. There is no hypermetabolic nodal activity. There are hepatic cysts measuring up to 4.6 cm in the central left lobe, devoid of metabolic activity. Sigmoid colon diverticular changes and prior hysterectomy noted.  SKELETON  There is no hypermetabolic activity to suggest osseous metastatic disease. Right apical mass demonstrates no definite rib destruction. There is decreased marrow activity within the upper thoracic spine consistent with prior radiation therapy.  IMPRESSION: 1. Significantly hypermetabolic right apical mass with potential pleural extension but no rib destruction. 2. Additional spiculated right upper lobe nodule is also hypermetabolic. 3. Single hypermetabolic right paratracheal lymph node. 4. No evidence of extra thoracic metastatic disease.   Electronically Signed   By: Richardean Sale M.D.   On: 01/26/2015 10:01     ASSESSMENT/PLAN:  No problem-specific assessment & plan notes found for this encounter.  patient is a pleasant 59 year old Caucasian female recently diagnosed with stage IIIB non-small cell lung cancer adenocarcinoma. She has completed a course of concurrent chemoradiation near her home in [facility]. She had a restaging PET scan revealing significantly hypermetabolic right apical mass with potential pleural extension but no rib destruction. There was an additional spiculated right upper lobe nodule also hypermetabolic and a single hypermetabolic right paratracheal lymph node. There is no evidence for extrathoracic metastatic disease. The patient was discussed with and also seen by Dr. Julien Nordmann. She will proceed with her first cycle of consolidation chemotherapy with carboplatin for an AUC of 5 and Alimta 500 mg/m given every 3 weeks. 4 severe nausea and prescription for Zyprexa 10 mg to be taken at bedtime was sent to her  pharmacy of record via E scribe. She is to start with Compazine 10 mg by mouth every 6 hours as needed for nausea. She was instructed that she can also take her Ativan 0.5 mg sublingually or by mouth every 8 hours if needed for nausea. Both she and her husband voiced understanding. She will obtain weekly labs closer home and have the results faxed to Dr. Worthy Flank attention. She'll follow-up in 3 weeks prior to the start of cycle #2 of her consolidation chemotherapy. She will restart her gabapentin for pain control.  Awilda Metro E, PA-C 01/26/2015 All questions were answered. The patient knows to call the clinic with any problems, questions or concerns. We can certainly see the patient much sooner if necessary.  ADDENDUM: Hematology/Oncology Attending: I had a face to face encounter with the patient. I recommended her care plan. This is a very pleasant 59 years old white female with a stage IIIB non-small cell lung cancer with negative EGFR mutation and negative ALK gene translocation as well  as negative ROS 1. She completed a course of concurrent chemoradiation with weekly carboplatin and paclitaxel but the patient continues to have persistent disease. A recent PET scan showed persistent disease in the right upper lobe as well as mediastinal lymphadenopathy. I discussed the scan results with the patient and her husband today. I recommended for the patient consolidation chemotherapy with 3 cycles of carboplatin and Alimta. She is here today to start the first cycle of her treatment. The patient has a history of chemotherapy-induced nausea and vomiting after her previous chemotherapy. Patient will continue her current antiemetics was Compazine. We have the patient prescription for Ativan and Zyprexa to be used if she has any refractory nausea or vomiting. She will have her weekly blood work performed close to home in [place]. The patient would come back for follow-up visit in 3 weeks for reevaluation  before starting cycle #2. She was advised to call immediately if she has any concerning symptoms in the interval.  Disclaimer: This note was dictated with voice recognition software. Similar sounding words can inadvertently be transcribed and may not be corrected upon review. Eilleen Kempf., MD 01/30/2015

## 2015-01-27 ENCOUNTER — Telehealth: Payer: Self-pay | Admitting: Medical Oncology

## 2015-01-27 NOTE — Telephone Encounter (Signed)
Doing well .  She reports a little nausea today , face flushed-took her post chemo steroids . Voice is strong and she understands to call for any concerns ." Everybody was great tell Ned Card Hello".

## 2015-01-28 ENCOUNTER — Encounter: Payer: Self-pay | Admitting: *Deleted

## 2015-01-28 NOTE — Patient Instructions (Signed)
Send the disc with your imaging studies from [place] to Dr. Julien Nordmann or bring it with you to your next visit so that our radiology Department can use it for comparison Your PET scan revealed disease within the right lung, there was no evidence of disease outside of the lung or involving the bones Take your antiemetics as prescribed, reserving the Zyprexa at bedtime for severe nausea and vomiting only Obtain weekly labs locally and have the results faxed to Dr. Julien Nordmann Follow-up in 3 weeks, prior to the start of your next scheduled cycle of consolidation chemotherapy

## 2015-01-28 NOTE — [facility] Oncology Navigator Note (Unsigned)
I called [place] to see if tissue has been sent to [facility].  Pathology tech, Clarise Cruz stated the tissue went out 01/27/15.

## 2015-02-06 ENCOUNTER — Other Ambulatory Visit: Payer: Self-pay | Admitting: Internal Medicine

## 2015-02-08 ENCOUNTER — Telehealth: Payer: Self-pay | Admitting: Medical Oncology

## 2015-02-08 NOTE — Telephone Encounter (Signed)
Reviewed labs with pt and when to call. She is going to [employer] today for several days . I reminded her to take her chemo alert card with her in case she has to go to ED. She verbalizes understanding.

## 2015-02-16 ENCOUNTER — Ambulatory Visit (HOSPITAL_BASED_OUTPATIENT_CLINIC_OR_DEPARTMENT_OTHER): Payer: BLUE CROSS/BLUE SHIELD

## 2015-02-16 ENCOUNTER — Ambulatory Visit (HOSPITAL_BASED_OUTPATIENT_CLINIC_OR_DEPARTMENT_OTHER): Payer: BLUE CROSS/BLUE SHIELD | Admitting: Internal Medicine

## 2015-02-16 ENCOUNTER — Ambulatory Visit: Payer: BLUE CROSS/BLUE SHIELD

## 2015-02-16 ENCOUNTER — Encounter: Payer: Self-pay | Admitting: Internal Medicine

## 2015-02-16 ENCOUNTER — Other Ambulatory Visit (HOSPITAL_BASED_OUTPATIENT_CLINIC_OR_DEPARTMENT_OTHER): Payer: BLUE CROSS/BLUE SHIELD

## 2015-02-16 VITALS — BP 151/79 | HR 70 | Temp 98.1°F | Resp 18 | Ht 65.0 in | Wt 177.3 lb

## 2015-02-16 DIAGNOSIS — Z5111 Encounter for antineoplastic chemotherapy: Secondary | ICD-10-CM | POA: Diagnosis not present

## 2015-02-16 DIAGNOSIS — C3411 Malignant neoplasm of upper lobe, right bronchus or lung: Secondary | ICD-10-CM

## 2015-02-16 DIAGNOSIS — Z95828 Presence of other vascular implants and grafts: Secondary | ICD-10-CM

## 2015-02-16 DIAGNOSIS — Z5112 Encounter for antineoplastic immunotherapy: Secondary | ICD-10-CM | POA: Diagnosis not present

## 2015-02-16 LAB — CBC WITH DIFFERENTIAL/PLATELET
BASO%: 0 % (ref 0.0–2.0)
Basophils Absolute: 0 10*3/uL (ref 0.0–0.1)
EOS%: 0 % (ref 0.0–7.0)
Eosinophils Absolute: 0 10*3/uL (ref 0.0–0.5)
HCT: 33 % — ABNORMAL LOW (ref 34.8–46.6)
HEMOGLOBIN: 11 g/dL — AB (ref 11.6–15.9)
LYMPH#: 0.3 10*3/uL — AB (ref 0.9–3.3)
LYMPH%: 6 % — AB (ref 14.0–49.7)
MCH: 29.9 pg (ref 25.1–34.0)
MCHC: 33.3 g/dL (ref 31.5–36.0)
MCV: 89.7 fL (ref 79.5–101.0)
MONO#: 0.4 10*3/uL (ref 0.1–0.9)
MONO%: 7.5 % (ref 0.0–14.0)
NEUT#: 4.6 10*3/uL (ref 1.5–6.5)
NEUT%: 86.5 % — ABNORMAL HIGH (ref 38.4–76.8)
Platelets: 251 10*3/uL (ref 145–400)
RBC: 3.68 10*6/uL — ABNORMAL LOW (ref 3.70–5.45)
RDW: 15.8 % — ABNORMAL HIGH (ref 11.2–14.5)
WBC: 5.4 10*3/uL (ref 3.9–10.3)

## 2015-02-16 LAB — COMPREHENSIVE METABOLIC PANEL (CC13)
ALT: 25 U/L (ref 0–55)
ANION GAP: 12 meq/L — AB (ref 3–11)
AST: 21 U/L (ref 5–34)
Albumin: 3.6 g/dL (ref 3.5–5.0)
Alkaline Phosphatase: 119 U/L (ref 40–150)
BUN: 11.2 mg/dL (ref 7.0–26.0)
CALCIUM: 9.5 mg/dL (ref 8.4–10.4)
CHLORIDE: 105 meq/L (ref 98–109)
CO2: 22 mEq/L (ref 22–29)
CREATININE: 0.8 mg/dL (ref 0.6–1.1)
EGFR: 84 mL/min/{1.73_m2} — ABNORMAL LOW (ref >90)
Glucose: 105 mg/dl (ref 70–140)
Potassium: 4.1 mEq/L (ref 3.5–5.1)
Sodium: 139 mEq/L (ref 136–145)
Total Protein: 7.5 g/dL (ref 6.4–8.3)

## 2015-02-16 MED ORDER — SODIUM CHLORIDE 0.9 % IV SOLN
500.0000 mg/m2 | Freq: Once | INTRAVENOUS | Status: AC
  Administered 2015-02-16: 950 mg via INTRAVENOUS
  Filled 2015-02-16: qty 38

## 2015-02-16 MED ORDER — SODIUM CHLORIDE 0.9 % IV SOLN
592.0000 mg | Freq: Once | INTRAVENOUS | Status: AC
  Administered 2015-02-16: 590 mg via INTRAVENOUS
  Filled 2015-02-16: qty 59

## 2015-02-16 MED ORDER — SODIUM CHLORIDE 0.9 % IJ SOLN
10.0000 mL | INTRAMUSCULAR | Status: DC | PRN
  Administered 2015-02-16: 10 mL
  Filled 2015-02-16: qty 10

## 2015-02-16 MED ORDER — HEPARIN SOD (PORK) LOCK FLUSH 100 UNIT/ML IV SOLN
500.0000 [IU] | Freq: Once | INTRAVENOUS | Status: AC | PRN
  Administered 2015-02-16: 500 [IU]
  Filled 2015-02-16: qty 5

## 2015-02-16 MED ORDER — SODIUM CHLORIDE 0.9 % IV SOLN
Freq: Once | INTRAVENOUS | Status: AC
  Administered 2015-02-16: 12:00:00 via INTRAVENOUS
  Filled 2015-02-16: qty 8

## 2015-02-16 MED ORDER — SODIUM CHLORIDE 0.9 % IV SOLN
Freq: Once | INTRAVENOUS | Status: AC
  Administered 2015-02-16: 12:00:00 via INTRAVENOUS

## 2015-02-16 MED ORDER — SODIUM CHLORIDE 0.9 % IJ SOLN
10.0000 mL | INTRAMUSCULAR | Status: DC | PRN
  Administered 2015-02-16: 10 mL via INTRAVENOUS
  Filled 2015-02-16: qty 10

## 2015-02-16 NOTE — Patient Instructions (Signed)
[facility] Discharge Instructions for Patients Receiving Chemotherapy  Today you received the following chemotherapy agents Alimta/Carboplatin  To help prevent nausea and vomiting after your treatment, we encourage you to take your nausea medication as prescribed.   If you develop nausea and vomiting that is not controlled by your nausea medication, call the clinic.   BELOW ARE SYMPTOMS THAT SHOULD BE REPORTED IMMEDIATELY:  *FEVER GREATER THAN 100.5 F  *CHILLS WITH OR WITHOUT FEVER  NAUSEA AND VOMITING THAT IS NOT CONTROLLED WITH YOUR NAUSEA MEDICATION  *UNUSUAL SHORTNESS OF BREATH  *UNUSUAL BRUISING OR BLEEDING  TENDERNESS IN MOUTH AND THROAT WITH OR WITHOUT PRESENCE OF ULCERS  *URINARY PROBLEMS  *BOWEL PROBLEMS  UNUSUAL RASH Items with * indicate a potential emergency and should be followed up as soon as possible.  Feel free to call the clinic you have any questions or concerns. The clinic phone number is [phone].  Please show the CHEMO ALERT CARD at check-in to the Emergency Department and triage nurse.   

## 2015-02-16 NOTE — Patient Instructions (Signed)

## 2015-02-16 NOTE — Progress Notes (Signed)
[place] Telephone:([phone]   Fax:([phone]) [phone]  OFFICE PROGRESS NOTE  Arbutus Leas, NP [id number] [facility] [place] [address]  DIAGNOSIS: Stage IIIB (T4, N2, M0) non-small cell lung cancer with negative EGFR mutation, negative ALK gene translocation and negative ROS 1 gene rearrangement diagnosed in November 2015.  PRIOR THERAPY: Status post a course of concurrent chemoradiation with weekly carboplatin and paclitaxel with no significant change in her disease. This treatment was given in [place], [place].  CURRENT THERAPY: Consolidation systemic chemotherapy with carboplatin for AUC of 5 and Alimta 500 MG/M2 every 3 weeks. Status post one cycle.  INTERVAL HISTORY: Judith Gilbert 59 y.o. female returns to the clinic today for follow-up visit accompanied by her husband. The patient tolerated the first cycle of her systemic treatment with carboplatin and Alimta fairly well with no significant complaints except for one day of nausea a few days after her chemotherapy and resolved with Compazine. The patient denied having any significant fever or chills, she has weight loss or night sweats. She denied having any significant chest pain, shortness of breath, cough or hemoptysis. Her tissue block was sent to [facility] for molecular studies and the report is still pending.  MEDICAL HISTORY:History reviewed. No pertinent past medical history.  ALLERGIES:  is allergic to percocet.  MEDICATIONS:  Current Outpatient Prescriptions  Medication Sig Dispense Refill  . dexamethasone (DECADRON) 4 MG tablet 4 mg by mouth twice a day the day before, day of and day after the chemotherapy every 3 weeks. 40 tablet 0  . folic acid (FOLVITE) 1 MG tablet Take 1 tablet (1 mg total) by mouth daily. 30 tablet 2  . gabapentin (NEURONTIN) 300 MG capsule Take 300 mg by mouth 2 (two) times daily.    Marland Kitchen LORazepam (ATIVAN) 0.5 MG tablet Take 1 tablet (0.5 mg total) by  mouth every 8 (eight) hours as needed for anxiety (Take one tab before scan.). 30 tablet 0  . montelukast (SINGULAIR) 10 MG tablet Take 10 mg by mouth at bedtime.    Marland Kitchen OLANZapine (ZYPREXA) 10 MG tablet Take 1 tablet (10 mg total) by mouth at bedtime. 30 tablet 0  . prochlorperazine (COMPAZINE) 10 MG tablet Take 1 tablet (10 mg total) by mouth every 6 (six) hours as needed for nausea or vomiting. 30 tablet 0  . venlafaxine (EFFEXOR) 75 MG tablet Take 75 mg by mouth daily.     No current facility-administered medications for this visit.    SURGICAL HISTORY: History reviewed. No pertinent past surgical history.  REVIEW OF SYSTEMS:  A comprehensive review of systems was negative.   PHYSICAL EXAMINATION: General appearance: alert, cooperative and no distress Head: Normocephalic, without obvious abnormality, atraumatic Neck: no adenopathy, no JVD, supple, symmetrical, trachea midline and thyroid not enlarged, symmetric, no tenderness/mass/nodules Lymph nodes: Cervical, supraclavicular, and axillary nodes normal. Resp: clear to auscultation bilaterally Back: symmetric, no curvature. ROM normal. No CVA tenderness. Cardio: regular rate and rhythm, S1, S2 normal, no murmur, click, rub or gallop GI: soft, non-tender; bowel sounds normal; no masses,  no organomegaly Extremities: extremities normal, atraumatic, no cyanosis or edema  ECOG PERFORMANCE STATUS: 0 - Asymptomatic  Blood pressure 151/79, pulse 70, temperature 98.1 F (36.7 C), temperature source Oral, resp. rate 18, height 5' 5"  (1.651 m), weight 177 lb 4.8 oz (80.423 kg), SpO2 98 %.  LABORATORY DATA: Lab Results  Component Value Date   WBC 5.4 02/16/2015   HGB 11.0*  02/16/2015   HCT 33.0* 02/16/2015   MCV 89.7 02/16/2015   PLT 251 02/16/2015      Chemistry      Component Value Date/Time   NA 139 02/16/2015 0951   K 4.1 02/16/2015 0951   CO2 22 02/16/2015 0951   BUN 11.2 02/16/2015 0951   CREATININE 0.8 02/16/2015 0951        Component Value Date/Time   CALCIUM 9.5 02/16/2015 0951   ALKPHOS 119 02/16/2015 0951   AST 21 02/16/2015 0951   ALT 25 02/16/2015 0951   BILITOT <0.20 02/16/2015 0951       RADIOGRAPHIC STUDIES: Nm Pet Image Initial (pi) Skull Base To Thigh  01/26/2015   CLINICAL DATA:  Subsequent treatment strategy for lung cancer. Chemotherapy and radiation therapy completed. Subsequent encounter.  EXAM: NUCLEAR MEDICINE PET SKULL BASE TO THIGH  TECHNIQUE: 8.48 mCi F-18 FDG was injected intravenously. Full-ring PET imaging was performed from the skull base to thigh after the radiotracer. CT data was obtained and used for attenuation correction and anatomic localization.  FASTING BLOOD GLUCOSE:  Value: 120 mg/dl  COMPARISON:  None available.  FINDINGS: NECK  No hypermetabolic cervical lymph nodes are identified.There are no lesions of the pharyngeal mucosal space. There is physiologic activity associated with the muscles of phonation. 9 mm right thyroid nodule on image 47 does not show any increased metabolic activity.  CHEST  Well-circumscribed right apical mass measures 5.0 x 3.4 cm and is significantly hypermetabolic with an SUV max of 12.9. This lesion demonstrates no significant central necrosis. Pleural extension of this lesion cannot be excluded, although there is no rib destruction. A spiculated right upper lobe nodule measuring 1.5 cm on image 16 is also hypermetabolic for size with an SUV max of 3.5. No other suspicious pulmonary findings. There is a hypermetabolic right paratracheal node measuring 1.5 cm short axis on image 61. This has an SUV max of 7.2. No other hypermetabolic nodal activity identified. Left IJ Port-A-Cath tip in the lower SVC near the SVC right atrial junction.  ABDOMEN/PELVIS  There is no hypermetabolic activity within the liver, adrenal glands, spleen or pancreas. There is no hypermetabolic nodal activity. There are hepatic cysts measuring up to 4.6 cm in the central left lobe, devoid  of metabolic activity. Sigmoid colon diverticular changes and prior hysterectomy noted.  SKELETON  There is no hypermetabolic activity to suggest osseous metastatic disease. Right apical mass demonstrates no definite rib destruction. There is decreased marrow activity within the upper thoracic spine consistent with prior radiation therapy.  IMPRESSION: 1. Significantly hypermetabolic right apical mass with potential pleural extension but no rib destruction. 2. Additional spiculated right upper lobe nodule is also hypermetabolic. 3. Single hypermetabolic right paratracheal lymph node. 4. No evidence of extra thoracic metastatic disease.   Electronically Signed   By: Richardean Sale M.D.   On: 01/26/2015 10:01    ASSESSMENT AND PLAN: This is a very pleasant 59 years old white female with a stage IIIB non-small cell lung cancer, adenocarcinoma status post a course of concurrent chemotherapy and she is currently undergoing consolidation chemotherapy with carboplatin and Alimta status post 1 cycle. The patient related the first cycle of her treatment fairly well with no significant adverse effects. I recommended for her to proceed with cycle #2 today as a scheduled. She will come back for follow-up visit in 3 weeks for reevaluation and management of any adverse effect of her treatment. The patient was advised to call immediately if she has  any concerning symptoms in the interval. The patient voices understanding of current disease status and treatment options and is in agreement with the current care plan.  All questions were answered. The patient knows to call the clinic with any problems, questions or concerns. We can certainly see the patient much sooner if necessary.  Disclaimer: This note was dictated with voice recognition software. Similar sounding words can inadvertently be transcribed and may not be corrected upon review.

## 2015-02-18 ENCOUNTER — Telehealth: Payer: Self-pay | Admitting: *Deleted

## 2015-02-18 MED ORDER — LORAZEPAM 0.5 MG PO TABS: ORAL_TABLET | ORAL | Status: DC

## 2015-02-18 NOTE — Telephone Encounter (Signed)
Patient called and left a vm message to call.  I called her back.  Patient states she is anxious about her last treatment and if it is going to work.  I listened as she explained her feelings. She stated she needed something for anxiety.  I stated I thought that would be a good idea.  I also asked if she would like CSW to be notified and speak with her.  She stated yes.  I will notify CSW. Judith Gilbert regarding patient and asked that patient call triage to regarding anxiety medication.  I gave her the phone number.  I asked that she call back for further assistance.

## 2015-02-18 NOTE — Telephone Encounter (Signed)
PT. HAS TRIED THE LORAZEPAM 0.'5MG'$  BEFORE TREATMENT AND AT BEDTIME BUT IT IS NOT HELPING HER ANXIETY. ANY SUGGESTIONS?

## 2015-02-18 NOTE — Addendum Note (Signed)
Addended by: Wyonia Hough on: 02/18/2015 03:19 PM   Modules accepted: Orders, Medications

## 2015-02-18 NOTE — Telephone Encounter (Signed)
[place] ORDER AND READ BACK TO CYNDEE BACON,NP- PT. MAY TAKE LORAZEPAM ONE TO TWO TABLETS EVERY EIGHT HOURS AS NEEDED FOR ANXIETY. THIS PRESCRIPTION WAS CALLED TO PT.'S PHARMACY. NOTIFIED PT. SHE VOICES UNDERSTANDING.

## 2015-02-21 ENCOUNTER — Telehealth: Payer: Self-pay | Admitting: *Deleted

## 2015-02-21 ENCOUNTER — Telehealth: Payer: Self-pay

## 2015-02-21 MED ORDER — ALPRAZOLAM 0.25 MG PO TABS: 0.2500 mg | ORAL_TABLET | Freq: Three times a day (TID) | ORAL | Status: DC

## 2015-02-21 NOTE — Telephone Encounter (Signed)
Pt called 2nd time asking for something other than ativan.

## 2015-02-21 NOTE — Telephone Encounter (Signed)
Pt stated she was prescibed ativan for relaxation and she got the opposite affect. She would like a different RX

## 2015-02-21 NOTE — Telephone Encounter (Signed)
It made me wound up and shaking, I took 2 and it was ok then 8 hours and it was terrible, made me wound up." Reviewed with MD, VO for Xanax 0.'25mg'$  q8hrs. Medication cannot be e-scribed, Rx for Xanax 0.'25mg'$  called into pt's pharmacy  Discussed with pt to stop Ativan.

## 2015-02-23 ENCOUNTER — Encounter: Payer: Self-pay | Admitting: *Deleted

## 2015-02-23 NOTE — Progress Notes (Signed)
[place] Work  Clinical Social Work was referred by Development worker, international aid for assessment of psychosocial needs.  Clinical Social Worker contacted patient by phone to offer support and assess for needs.  Mrs. Massing shared she has been experiencing anxiety since receiving her second line of chemotherapy and often worries that treatment is "not working".  CSW validated and normalized patient's feelings of fear and worry surrounding the uncertainty related to treatment.  Mrs. Offner shared she often talks with her daughter in law when feeling overwhelmed, however, she does "not want to worry the family".  She reported her spouse, children, and friends are very supportive.  She identified many positive coping skills to deal with anxiety including walking when she has the energy, planting in the garden or simply "playing in the dirt", coloring in her adult coloring book, and playing with her two small dogs.  She shared she has recently begun taking anti-anxiety medication.  We discussed the importance of utilizing medication, but also other modalities to cope with anxiety.  CSW discussed adjustment counseling offered through CSW at cancer center and meditation CD.  Patient was interested in these modalities and plans to meet with CSW during upcoming infusion.   Polo Riley, MSW, LCSW, OSW-C Clinical Social Worker [facility] [phone]

## 2015-02-25 ENCOUNTER — Other Ambulatory Visit: Payer: Self-pay | Admitting: Internal Medicine

## 2015-03-09 ENCOUNTER — Ambulatory Visit: Payer: BLUE CROSS/BLUE SHIELD

## 2015-03-09 ENCOUNTER — Encounter: Payer: Self-pay | Admitting: Physician Assistant

## 2015-03-09 ENCOUNTER — Ambulatory Visit (HOSPITAL_BASED_OUTPATIENT_CLINIC_OR_DEPARTMENT_OTHER): Payer: BLUE CROSS/BLUE SHIELD | Admitting: Physician Assistant

## 2015-03-09 ENCOUNTER — Ambulatory Visit (HOSPITAL_BASED_OUTPATIENT_CLINIC_OR_DEPARTMENT_OTHER): Payer: BLUE CROSS/BLUE SHIELD

## 2015-03-09 ENCOUNTER — Telehealth: Payer: Self-pay | Admitting: Physician Assistant

## 2015-03-09 ENCOUNTER — Other Ambulatory Visit (HOSPITAL_BASED_OUTPATIENT_CLINIC_OR_DEPARTMENT_OTHER): Payer: BLUE CROSS/BLUE SHIELD

## 2015-03-09 VITALS — BP 137/72 | HR 64

## 2015-03-09 VITALS — BP 163/92 | HR 70 | Temp 98.0°F | Resp 19 | Ht 65.0 in | Wt 176.9 lb

## 2015-03-09 DIAGNOSIS — R918 Other nonspecific abnormal finding of lung field: Secondary | ICD-10-CM

## 2015-03-09 DIAGNOSIS — C3411 Malignant neoplasm of upper lobe, right bronchus or lung: Secondary | ICD-10-CM

## 2015-03-09 DIAGNOSIS — Z5111 Encounter for antineoplastic chemotherapy: Secondary | ICD-10-CM | POA: Diagnosis not present

## 2015-03-09 DIAGNOSIS — R110 Nausea: Secondary | ICD-10-CM

## 2015-03-09 DIAGNOSIS — Z95828 Presence of other vascular implants and grafts: Secondary | ICD-10-CM

## 2015-03-09 DIAGNOSIS — R197 Diarrhea, unspecified: Secondary | ICD-10-CM

## 2015-03-09 LAB — CBC WITH DIFFERENTIAL/PLATELET
BASO%: 0.1 % (ref 0.0–2.0)
Basophils Absolute: 0 10*3/uL (ref 0.0–0.1)
EOS%: 0 % (ref 0.0–7.0)
Eosinophils Absolute: 0 10*3/uL (ref 0.0–0.5)
HCT: 34.4 % — ABNORMAL LOW (ref 34.8–46.6)
HGB: 11.4 g/dL — ABNORMAL LOW (ref 11.6–15.9)
LYMPH%: 4.9 % — ABNORMAL LOW (ref 14.0–49.7)
MCH: 29.8 pg (ref 25.1–34.0)
MCHC: 33 g/dL (ref 31.5–36.0)
MCV: 90.1 fL (ref 79.5–101.0)
MONO#: 0.3 10*3/uL (ref 0.1–0.9)
MONO%: 5.3 % (ref 0.0–14.0)
NEUT#: 5.1 10*3/uL (ref 1.5–6.5)
NEUT%: 89.7 % — ABNORMAL HIGH (ref 38.4–76.8)
Platelets: 302 10*3/uL (ref 145–400)
RBC: 3.82 10*6/uL (ref 3.70–5.45)
RDW: 17.2 % — AB (ref 11.2–14.5)
WBC: 5.7 10*3/uL (ref 3.9–10.3)
lymph#: 0.3 10*3/uL — ABNORMAL LOW (ref 0.9–3.3)

## 2015-03-09 LAB — COMPREHENSIVE METABOLIC PANEL (CC13)
ALBUMIN: 4 g/dL (ref 3.5–5.0)
ALK PHOS: 119 U/L (ref 40–150)
ALT: 20 U/L (ref 0–55)
AST: 20 U/L (ref 5–34)
Anion Gap: 14 mEq/L — ABNORMAL HIGH (ref 3–11)
BUN: 12.8 mg/dL (ref 7.0–26.0)
CO2: 23 mEq/L (ref 22–29)
Calcium: 9.8 mg/dL (ref 8.4–10.4)
Chloride: 104 mEq/L (ref 98–109)
Creatinine: 0.8 mg/dL (ref 0.6–1.1)
EGFR: 79 mL/min/{1.73_m2} — ABNORMAL LOW (ref >90)
GLUCOSE: 120 mg/dL (ref 70–140)
Potassium: 4.4 mEq/L (ref 3.5–5.1)
SODIUM: 141 meq/L (ref 136–145)
Total Bilirubin: 0.2 mg/dL (ref 0.20–1.20)
Total Protein: 8.1 g/dL (ref 6.4–8.3)

## 2015-03-09 MED ORDER — HEPARIN SOD (PORK) LOCK FLUSH 100 UNIT/ML IV SOLN
500.0000 [IU] | Freq: Once | INTRAVENOUS | Status: AC
  Administered 2015-03-09: 500 [IU] via INTRAVENOUS
  Filled 2015-03-09: qty 5

## 2015-03-09 MED ORDER — SODIUM CHLORIDE 0.9 % IV SOLN
592.0000 mg | Freq: Once | INTRAVENOUS | Status: AC
  Administered 2015-03-09: 590 mg via INTRAVENOUS
  Filled 2015-03-09: qty 59

## 2015-03-09 MED ORDER — OLANZAPINE 10 MG PO TABS: 10.0000 mg | ORAL_TABLET | Freq: Every day | ORAL | Status: DC

## 2015-03-09 MED ORDER — SODIUM CHLORIDE 0.9 % IV SOLN
Freq: Once | INTRAVENOUS | Status: AC
  Administered 2015-03-09: 15:00:00 via INTRAVENOUS
  Filled 2015-03-09: qty 8

## 2015-03-09 MED ORDER — SODIUM CHLORIDE 0.9 % IV SOLN
500.0000 mg/m2 | Freq: Once | INTRAVENOUS | Status: AC
  Administered 2015-03-09: 950 mg via INTRAVENOUS
  Filled 2015-03-09: qty 38

## 2015-03-09 MED ORDER — CYANOCOBALAMIN 1000 MCG/ML IJ SOLN
INTRAMUSCULAR | Status: AC
  Filled 2015-03-09: qty 1

## 2015-03-09 MED ORDER — SODIUM CHLORIDE 0.9 % IJ SOLN
10.0000 mL | INTRAMUSCULAR | Status: DC | PRN
  Administered 2015-03-09: 10 mL via INTRAVENOUS
  Filled 2015-03-09: qty 10

## 2015-03-09 MED ORDER — CYANOCOBALAMIN 1000 MCG/ML IJ SOLN
1000.0000 ug | Freq: Once | INTRAMUSCULAR | Status: AC
  Administered 2015-03-09: 1000 ug via INTRAMUSCULAR

## 2015-03-09 MED ORDER — DRONABINOL 5 MG PO CAPS: 5.0000 mg | ORAL_CAPSULE | Freq: Two times a day (BID) | ORAL | Status: DC

## 2015-03-09 MED ORDER — HEPARIN SOD (PORK) LOCK FLUSH 100 UNIT/ML IV SOLN
500.0000 [IU] | Freq: Once | INTRAVENOUS | Status: DC
  Filled 2015-03-09: qty 5

## 2015-03-09 MED ORDER — ALTEPLASE 2 MG IJ SOLR
2.0000 mg | Freq: Once | INTRAMUSCULAR | Status: AC | PRN
  Administered 2015-03-09: 2 mg
  Filled 2015-03-09: qty 2

## 2015-03-09 MED ORDER — ALPRAZOLAM 0.25 MG PO TABS: 0.2500 mg | ORAL_TABLET | Freq: Three times a day (TID) | ORAL | Status: DC

## 2015-03-09 MED ORDER — SODIUM CHLORIDE 0.9 % IV SOLN
Freq: Once | INTRAVENOUS | Status: AC
  Administered 2015-03-09: 15:00:00 via INTRAVENOUS

## 2015-03-09 NOTE — Patient Instructions (Signed)
[place] Discharge Instructions for Patients Receiving Chemotherapy  Today you received the following chemotherapy agents:  Alimta and Carboplatin  To help prevent nausea and vomiting after your treatment, we encourage you to take your nausea medication as ordered per MD.   If you develop nausea and vomiting that is not controlled by your nausea medication, call the clinic.   BELOW ARE SYMPTOMS THAT SHOULD BE REPORTED IMMEDIATELY:  *FEVER GREATER THAN 100.5 F  *CHILLS WITH OR WITHOUT FEVER  NAUSEA AND VOMITING THAT IS NOT CONTROLLED WITH YOUR NAUSEA MEDICATION  *UNUSUAL SHORTNESS OF BREATH  *UNUSUAL BRUISING OR BLEEDING  TENDERNESS IN MOUTH AND THROAT WITH OR WITHOUT PRESENCE OF ULCERS  *URINARY PROBLEMS  *BOWEL PROBLEMS  UNUSUAL RASH Items with * indicate a potential emergency and should be followed up as soon as possible.  Feel free to call the clinic you have any questions or concerns. The clinic phone number is [phone].  Please show the [place] at check-in to the Emergency Department and triage nurse.

## 2015-03-09 NOTE — Patient Instructions (Signed)

## 2015-03-09 NOTE — Telephone Encounter (Signed)
Pt confirmed labs/ov per 05/25 POF, gave pt AVS and Calendar.... KJ

## 2015-03-09 NOTE — Progress Notes (Addendum)
[place] [phone]   [phone]  OFFICE PROGRESS NOTE  Arbutus Leas, NP [id number] [facility] [place] [address]  DIAGNOSIS: Stage IIIB (T4, N2, M0) non-small cell lung cancer with negative EGFR mutation, negative ALK gene translocation and negative ROS 1 gene rearrangement diagnosed in November 2015.  PRIOR THERAPY: Status post a course of concurrent chemoradiation with weekly carboplatin and paclitaxel with no significant change in her disease. This treatment was given in [place], [place].  CURRENT THERAPY: Consolidation systemic chemotherapy with carboplatin for AUC of 5 and Alimta 500 MG/M2 every 3 weeks. Status post 2 cycles.  INTERVAL HISTORY: Judith [place] 59 y.o. female returns to the clinic today for follow-up visit accompanied by her husband. Overall the patient has tolerated her systemic treatment with carboplatin and Alimta fairly well with no significant complaints except for persistent nausea and diarrhea. She states the compazine is not working for her nausea. She reports reasonable control when she was prescribed Marionl by her local cancer center. The patient denied having any significant fever or chills, she has weight loss or night sweats. She denied having any significant chest pain, shortness of breath, cough or hemoptysis. Her tissue block was sent to [facility] one for molecular studies and the report is still pending.  MEDICAL HISTORY:History reviewed. No pertinent past medical history.  ALLERGIES:  is allergic to percocet.  MEDICATIONS:  Current Outpatient Prescriptions  Medication Sig Dispense Refill  . ALPRAZolam (XANAX) 0.25 MG tablet Take 1 tablet (0.25 mg total) by mouth every 8 (eight) hours. 30 tablet 0  . dexamethasone (DECADRON) 4 MG tablet TAKE ONE TABLET BY MOUTH TWICE A DAY THE DAY BEFORE, THE DAY OF, AND THE DAY AFTER CHEMOTHERAPY EVERY 3 WEEKS 40 tablet 0  . folic acid (FOLVITE) 1 MG  tablet Take 1 tablet (1 mg total) by mouth daily. 30 tablet 2  . gabapentin (NEURONTIN) 300 MG capsule Take 300 mg by mouth 2 (two) times daily.    . montelukast (SINGULAIR) 10 MG tablet Take 10 mg by mouth at bedtime.    Marland Kitchen OLANZapine (ZYPREXA) 10 MG tablet Take 1 tablet (10 mg total) by mouth at bedtime. 30 tablet 0  . OLANZapine (ZYPREXA) 10 MG tablet Take 1 tablet (10 mg total) by mouth at bedtime. 30 tablet 0  . prochlorperazine (COMPAZINE) 10 MG tablet Take 1 tablet (10 mg total) by mouth every 6 (six) hours as needed for nausea or vomiting. 30 tablet 0  . venlafaxine (EFFEXOR) 75 MG tablet Take 75 mg by mouth daily.     No current facility-administered medications for this visit.   Facility-Administered Medications Ordered in Other Visits  Medication Dose Route Frequency Provider Last Rate Last Dose  . sodium chloride 0.9 % injection 10 mL  10 mL Intravenous PRN Curt Bears, MD   10 mL at 03/09/15 1623    SURGICAL HISTORY: History reviewed. No pertinent past surgical history.  REVIEW OF SYSTEMS:  A comprehensive review of systems was negative.   PHYSICAL EXAMINATION: General appearance: alert, cooperative and no distress Head: Normocephalic, without obvious abnormality, atraumatic Neck: no adenopathy, no JVD, supple, symmetrical, trachea midline and thyroid not enlarged, symmetric, no tenderness/mass/nodules Lymph nodes: Cervical, supraclavicular, and axillary nodes normal. Resp: clear to auscultation bilaterally Back: symmetric, no curvature. ROM normal. No CVA tenderness. Cardio: regular rate and rhythm, S1, S2 normal, no murmur, click, rub or gallop GI: soft, non-tender; bowel sounds normal; no masses,  no organomegaly Extremities: extremities normal, atraumatic, no cyanosis or edema  ECOG PERFORMANCE STATUS: 0 - Asymptomatic  Blood pressure 163/92, pulse 70, temperature 98 F (36.7 C), temperature source Oral, resp. rate 19, height _0  (1.651 m), weight 176 lb 14.4 oz  (80.241 kg), SpO2 100 %.  LABORATORY DATA: Lab Results  Component Value Date   WBC 5.7 03/09/2015   HGB 11.4* 03/09/2015   HCT 34.4* 03/09/2015   MCV 90.1 03/09/2015   PLT 302 03/09/2015      Chemistry      Component Value Date/Time   NA 141 03/09/2015 1019   K 4.4 03/09/2015 1019   CO2 23 03/09/2015 1019   BUN 12.8 03/09/2015 1019   CREATININE 0.8 03/09/2015 1019      Component Value Date/Time   CALCIUM 9.8 03/09/2015 1019   ALKPHOS 119 03/09/2015 1019   AST 20 03/09/2015 1019   ALT 20 03/09/2015 1019   BILITOT 0.20 03/09/2015 1019       RADIOGRAPHIC STUDIES: No results found.  ASSESSMENT AND PLAN: This is a very pleasant 59 years old white female with a stage IIIB non-small cell lung cancer, adenocarcinoma status post a course of concurrent chemotherapy and she is currently undergoing consolidation chemotherapy with carboplatin and Alimta status post 2 cycles. The patient  has tolerated her systemic treatment with carboplatin and Alimta fairly well with no significant complaints except for persistent nausea and diarrhea. The patient was discussed with and also seen by Dr. Julien Nordmann. She was given a prescription for Zyprexa to take at bedtime if needed for the nausea. She will proceed with cycle #3 today as a scheduled.  She will come back in 3-4 weeks with a restaging CT scan of her chest with contrast to re-evaluate her disease.  The patient was advised to call immediately if she has any concerning symptoms in the interval. The patient voices understanding of current disease status and treatment options and is in agreement with the current care plan.  All questions were answered. The patient knows to call the clinic with any problems, questions or concerns. We can certainly see the patient much sooner if necessary.  Carlton Adam, PA-C 03/09/2015   ADDENDUM: Hematology/Oncology Attending: I had a face to face encounter with the patient. I recommended her care  plan. This is a very pleasant 59 years old white female with a stage IIIB non-small cell lung cancer status post a course of concurrent chemoradiation with weekly carboplatin and paclitaxel. She is currently undergoing consolidation chemotherapy with carboplatin and Alimta status post 2 cycles. She is tolerating her treatment fairly well except for intermittent nausea. She has Zofran at home but no improvement. I recommended for the patient treatment with Zyprexa at bedtime for nausea. She will proceed with cycle #3 today as a scheduled. The patient would come back for follow-up visit in 3 weeks for reevaluation after repeating CT scan of the chest for restaging of her disease. The results of her molecular studies are not available for me at this point. We will call the lab in the [facility] oncology clinic 2 fax results to [place]. The patient was advised to call immediately if she has any concerning symptoms in the interval.  Disclaimer: This note was dictated with voice recognition software. Similar sounding words can inadvertently be transcribed and may be missed upon review. Eilleen Kempf., MD 03/14/2015

## 2015-03-13 NOTE — Patient Instructions (Signed)
Take Zyprexa as prescribed as needed for nausea Follow up in 3-4 weeks with a restaging CT scan of your chest to re-evaluate your disease

## 2015-03-16 ENCOUNTER — Telehealth: Payer: Self-pay | Admitting: *Deleted

## 2015-03-16 NOTE — Telephone Encounter (Signed)
She needs weekly lab until next visit. We received her Molecular studies. Will discuss results next visit.

## 2015-03-16 NOTE — Telephone Encounter (Signed)
Called patient to inform her she does need weekly labs until next visit on 04-11-2015.  The Molecular results will be reviewed at this visit as well.  Thanked me for follow up and returning call.

## 2015-03-16 NOTE — Telephone Encounter (Signed)
Call received from [facility] asking if she still "needs weekly blood work and if [employer] has received Social research officer, government results from [facility] in [place].  I believe Hinton Dyer has talked to Estill Bamberg who is my Navigator.  Insurance has already paid for the test."   No results from [facility] in [place].  Instructed patient Dr. Julien Nordmann orders labs during treatment.  Reports final treatment.  [place], Dr., Diana Eves, [place], Phone [phone].

## 2015-03-17 NOTE — Telephone Encounter (Signed)
I called pt to see if she needs more lab orders. She is going to [facility] tomorrow in [place] and she will call me if they need new orders.

## 2015-03-23 ENCOUNTER — Encounter: Payer: Self-pay | Admitting: *Deleted

## 2015-03-23 ENCOUNTER — Telehealth: Payer: Self-pay | Admitting: *Deleted

## 2015-03-23 NOTE — Progress Notes (Signed)
Oncology Nurse Navigator Documentation  Oncology Nurse Navigator Flowsheets 03/23/2015  Navigator Encounter Type Other  Treatment Phase Treatment  Time Spent with Patient 30   Patient's oncologist in [place]:  Dr. Debbe Bales Phone [phone]  Fax [phone]

## 2015-03-23 NOTE — Progress Notes (Signed)
Oncology Nurse Navigator Documentation  Oncology Nurse Navigator Flowsheets 03/23/2015  Navigator Encounter Type Other/Care Coordination   Treatment Phase Treatment/ Patient called and left me a voice mail message to call.  I called her back.  She would like to have last notes sent to Dr. Ovid Curd.  I called his office to get fax number.  I faxed with ok faxed received.   Time Spent with Patient 30

## 2015-04-06 ENCOUNTER — Other Ambulatory Visit: Payer: Self-pay | Admitting: Internal Medicine

## 2015-04-07 ENCOUNTER — Telehealth: Payer: Self-pay | Admitting: *Deleted

## 2015-04-07 NOTE — Telephone Encounter (Signed)
Received pt labs via fax from 6/22 clinic in [place]. Called notified pt she will not need lab appt. Pt to arrive for CT at [phone] POF to scheduling to cancel lab appt 6/27

## 2015-04-08 ENCOUNTER — Telehealth: Payer: Self-pay | Admitting: Internal Medicine

## 2015-04-08 NOTE — Telephone Encounter (Signed)
Per pof cx lab....done...per orders pof...the patient ok and aware

## 2015-04-11 ENCOUNTER — Other Ambulatory Visit: Payer: BLUE CROSS/BLUE SHIELD

## 2015-04-11 ENCOUNTER — Ambulatory Visit (HOSPITAL_BASED_OUTPATIENT_CLINIC_OR_DEPARTMENT_OTHER): Payer: BLUE CROSS/BLUE SHIELD | Admitting: Internal Medicine

## 2015-04-11 ENCOUNTER — Telehealth: Payer: Self-pay | Admitting: Internal Medicine

## 2015-04-11 ENCOUNTER — Ambulatory Visit (HOSPITAL_COMMUNITY): Payer: BLUE CROSS/BLUE SHIELD

## 2015-04-11 ENCOUNTER — Encounter: Payer: Self-pay | Admitting: Internal Medicine

## 2015-04-11 ENCOUNTER — Ambulatory Visit (HOSPITAL_COMMUNITY)
Admission: RE | Admit: 2015-04-11 | Discharge: 2015-04-11 | Disposition: A | Discharge status: Home / Self Care | Payer: BLUE CROSS/BLUE SHIELD | Source: Ambulatory Visit | Attending: Physician Assistant | Admitting: Physician Assistant

## 2015-04-11 VITALS — BP 135/80 | HR 96 | Temp 98.9°F | Resp 18 | Ht 65.0 in | Wt 181.6 lb

## 2015-04-11 DIAGNOSIS — Z923 Personal history of irradiation: Secondary | ICD-10-CM | POA: Diagnosis not present

## 2015-04-11 DIAGNOSIS — Z9221 Personal history of antineoplastic chemotherapy: Secondary | ICD-10-CM | POA: Insufficient documentation

## 2015-04-11 DIAGNOSIS — I2510 Atherosclerotic heart disease of native coronary artery without angina pectoris: Secondary | ICD-10-CM | POA: Insufficient documentation

## 2015-04-11 DIAGNOSIS — C3411 Malignant neoplasm of upper lobe, right bronchus or lung: Secondary | ICD-10-CM

## 2015-04-11 DIAGNOSIS — Z08 Encounter for follow-up examination after completed treatment for malignant neoplasm: Secondary | ICD-10-CM | POA: Diagnosis not present

## 2015-04-11 MED ORDER — IOHEXOL 300 MG/ML  SOLN
75.0000 mL | Freq: Once | INTRAMUSCULAR | Status: AC | PRN
  Administered 2015-04-11: 75 mL via INTRAVENOUS

## 2015-04-11 NOTE — Progress Notes (Signed)
[place] Telephone:(336) [phone]   Fax:(336) [phone]  OFFICE PROGRESS NOTE  Arbutus Leas, NP [id number] [facility] [place] [address]  DIAGNOSIS: Stage IIIB (T4, N2, M0) non-small cell lung cancer with negative EGFR mutation, negative ALK gene translocation and negative ROS 1 gene rearrangement diagnosed in November 2015.  PRIOR THERAPY: Status post a course of concurrent chemoradiation with weekly carboplatin and paclitaxel with no significant change in her disease. This treatment was given in [place], [place].  CURRENT THERAPY: Consolidation systemic chemotherapy with carboplatin for AUC of 5 and Alimta 500 MG/M2 every 3 weeks. Status post one cycle.  INTERVAL HISTORY: Mylei Brackeen 59 y.o. female returns to the clinic today for follow-up visit accompanied by her husband. The patient tolerated the course of consolidation systemic treatment with carboplatin and Alimta fairly well with no significant complaints except for few episodes of nausea. The patient denied having any significant fever or chills, she has weight loss or night sweats. She denied having any significant chest pain, shortness of breath, cough or hemoptysis. The molecular studies reported by [facility] one showed no EGFR mutation or ALK gene translocation and no other actionable mutations. The patient had repeat CT scan of the chest performed recently and she is here for evaluation and discussion of her scan results.   MEDICAL HISTORY:History reviewed. No pertinent past medical history.  ALLERGIES:  is allergic to percocet.  MEDICATIONS:  Current Outpatient Prescriptions  Medication Sig Dispense Refill  . folic acid (FOLVITE) 1 MG tablet TAKE ONE TABLET BY MOUTH DAILY START 1 WEEK BEFORE FIRST CHEMOTHERAPY 30 tablet 1  . gabapentin (NEURONTIN) 300 MG capsule Take 300 mg by mouth 2 (two) times daily.    . montelukast (SINGULAIR) 10 MG tablet Take 10 mg by mouth at bedtime.     . ondansetron (ZOFRAN) 4 MG tablet Take 4 mg by mouth every 8 (eight) hours as needed.    . venlafaxine (EFFEXOR) 75 MG tablet Take 75 mg by mouth daily.    Marland Kitchen dexamethasone (DECADRON) 4 MG tablet TAKE ONE TABLET BY MOUTH TWICE A DAY THE DAY BEFORE, THE DAY OF, AND THE DAY AFTER CHEMOTHERAPY EVERY 3 WEEKS (Patient not taking: Reported on 04/11/2015) 40 tablet 0   No current facility-administered medications for this visit.    SURGICAL HISTORY: History reviewed. No pertinent past surgical history.  REVIEW OF SYSTEMS:  A comprehensive review of systems was negative.   PHYSICAL EXAMINATION: General appearance: alert, cooperative and no distress Head: Normocephalic, without obvious abnormality, atraumatic Neck: no adenopathy, no JVD, supple, symmetrical, trachea midline and thyroid not enlarged, symmetric, no tenderness/mass/nodules Lymph nodes: Cervical, supraclavicular, and axillary nodes normal. Resp: clear to auscultation bilaterally Back: symmetric, no curvature. ROM normal. No CVA tenderness. Cardio: regular rate and rhythm, S1, S2 normal, no murmur, click, rub or gallop GI: soft, non-tender; bowel sounds normal; no masses,  no organomegaly Extremities: extremities normal, atraumatic, no cyanosis or edema  ECOG PERFORMANCE STATUS: 0 - Asymptomatic  Blood pressure 135/80, pulse 96, temperature 98.9 F (37.2 C), temperature source Oral, resp. rate 18, height 5' 5"  (1.651 m), weight 181 lb 9.6 oz (82.373 kg), SpO2 100 %.  LABORATORY DATA: Lab Results  Component Value Date   WBC 5.7 03/09/2015   HGB 11.4* 03/09/2015   HCT 34.4* 03/09/2015   MCV 90.1 03/09/2015   PLT 302 03/09/2015      Chemistry      Component Value Date/Time  NA 141 03/09/2015 1019   K 4.4 03/09/2015 1019   CO2 23 03/09/2015 1019   BUN 12.8 03/09/2015 1019   CREATININE 0.8 03/09/2015 1019      Component Value Date/Time   CALCIUM 9.8 03/09/2015 1019   ALKPHOS 119 03/09/2015 1019   AST 20 03/09/2015 1019    ALT 20 03/09/2015 1019   BILITOT 0.20 03/09/2015 1019       RADIOGRAPHIC STUDIES: Ct Chest W Contrast  04/11/2015   CLINICAL DATA:  Right upper lobe lung cancer, history of chemotherapy and radiation therapy both complete.  EXAM: CT CHEST WITH CONTRAST  TECHNIQUE: Multidetector CT imaging of the chest was performed during intravenous contrast administration.  CONTRAST:  3m OMNIPAQUE IOHEXOL 300 MG/ML  SOLN  COMPARISON:  PET 01/26/2015.  FINDINGS: Mediastinum/Nodes: Left IJ Port-A-Cath terminates at the SVC RA junction. A necrotic appearing low right paratracheal lymph node measures 1.4 cm in short axis, stable in size. No hilar or axillary adenopathy. Heart size normal. Coronary artery calcification. No pericardial effusion.  Lungs/Pleura: Apical segment right upper lobe mass measures 3.3 x 4.1 cm (previously 3.4 x 5.0 cm). Surrounding volume loss and ground-glass the right upper lobe. A separate nodular component along the minor fissure measures 9 x 13 mm (series 5, image 14), stable. Nodular densities along the posterior margin of the minor fissure (series 5, images 17 and 18) appear new. Left lung is clear. No pleural fluid. Airway is unremarkable.  Upper abdomen: Low-attenuation lesions in the liver measure up to 4.1 cm, stable. Visualized portions of the liver, gallbladder, adrenal glands, kidneys, spleen, pancreas, stomach and bowel are otherwise grossly unremarkable. No upper abdominal adenopathy.  Musculoskeletal: No worrisome lytic or sclerotic lesions.  IMPRESSION: 1. Slight interval decrease in size of apical segment right upper lobe mass with stable right paratracheal adenopathy. 2. Right upper lobe nodule, along the minor fissure, stable. 3. Slight nodularity along the posterior margin of the minor fissure appears new. Attention on follow-up exam is warranted. 4. Coronary artery calcification.   Electronically Signed   By: MLorin PicketM.D.   On: 04/11/2015 09:50    ASSESSMENT AND  PLAN: This is a very pleasant 59years old white female with a stage IIIB non-small cell lung cancer, adenocarcinoma status post a course of concurrent chemotherapy and she completed consolidation chemotherapy with carboplatin and Alimta status post 3 cycles. The recent CT scan of the chest showed further improvement in her disease. I had a lengthy discussion with the patient and her husband today about her condition. I recommended for her continuous observation and close monitoring with repeat CT scan of the chest in 3 months. The patient and her husband are interested in seeing a thoracic surgeon to discuss the possibility of surgical resection. I will arrange for the patient an appointment with Dr. HRoxan Hockeyfor evaluation and discussion of this option. The patient was advised to call immediately if she has any concerning symptoms in the interval. The patient voices understanding of current disease status and treatment options and is in agreement with the current care plan.  All questions were answered. The patient knows to call the clinic with any problems, questions or concerns. We can certainly see the patient much sooner if necessary.  Disclaimer: This note was dictated with voice recognition software. Similar sounding words can inadvertently be transcribed and may not be corrected upon review.

## 2015-04-11 NOTE — Telephone Encounter (Signed)
Pt confirmed labs/ov per 06/27 POF, gave pt AVS and Calendar.... KJ, pt request to be seen by MD same day due to coming from [place].... Sent msg to MD..Marland Kitchen

## 2015-04-12 ENCOUNTER — Telehealth: Payer: Self-pay | Admitting: Medical Oncology

## 2015-04-12 NOTE — Telephone Encounter (Signed)
Pt has rx for port flush at facility in [place]. She requested med records as she is going to f/u at [facility]. I told her [facility] MD can requests records off [employer],

## 2015-04-19 ENCOUNTER — Telehealth: Payer: Self-pay | Admitting: *Deleted

## 2015-04-19 ENCOUNTER — Other Ambulatory Visit: Payer: Self-pay | Admitting: *Deleted

## 2015-04-19 DIAGNOSIS — C3411 Malignant neoplasm of upper lobe, right bronchus or lung: Secondary | ICD-10-CM

## 2015-04-19 MED ORDER — LIDOCAINE-PRILOCAINE 2.5-2.5 % EX CREA: 1.0000 "application " | TOPICAL_CREAM | CUTANEOUS | Status: DC | PRN

## 2015-04-19 NOTE — Telephone Encounter (Signed)
Pt called requesting refill of Emla cream for port flush at her clinic close to home in [place], [place].  Dr. Julien Nordmann agreed for refill.  Script called in to pt's pharmacy [facility] as per pt's request.

## 2015-04-19 NOTE — Telephone Encounter (Signed)
Oncology Nurse Navigator Documentation  Oncology Nurse Navigator Flowsheets 04/19/2015  Navigator Encounter Type Other/Coordination of care/phone call   Treatment Phase Other/post chemo   Barriers/Navigation Needs No barriers at this time  Coordination of Care MD Appointments/Called patient to set up for thoracic clinic on 04/28/15 arrive at 3:45.  Patient verbalized understanding of appt time and place.   Time Spent with Patient 15

## 2015-04-28 ENCOUNTER — Ambulatory Visit: Payer: BLUE CROSS/BLUE SHIELD | Attending: Internal Medicine | Admitting: Physical Therapy

## 2015-04-28 ENCOUNTER — Encounter: Payer: Self-pay | Admitting: Thoracic Surgery (Cardiothoracic Vascular Surgery)

## 2015-04-28 ENCOUNTER — Institutional Professional Consult (permissible substitution) (INDEPENDENT_AMBULATORY_CARE_PROVIDER_SITE_OTHER): Payer: BLUE CROSS/BLUE SHIELD | Admitting: Thoracic Surgery (Cardiothoracic Vascular Surgery)

## 2015-04-28 DIAGNOSIS — R5381 Other malaise: Secondary | ICD-10-CM | POA: Insufficient documentation

## 2015-04-28 DIAGNOSIS — G540 Brachial plexus disorders: Secondary | ICD-10-CM | POA: Diagnosis not present

## 2015-04-28 DIAGNOSIS — R0602 Shortness of breath: Secondary | ICD-10-CM | POA: Insufficient documentation

## 2015-04-28 DIAGNOSIS — M25511 Pain in right shoulder: Secondary | ICD-10-CM | POA: Diagnosis not present

## 2015-04-28 DIAGNOSIS — C801 Malignant (primary) neoplasm, unspecified: Secondary | ICD-10-CM

## 2015-04-28 DIAGNOSIS — C3491 Malignant neoplasm of unspecified part of right bronchus or lung: Secondary | ICD-10-CM | POA: Diagnosis not present

## 2015-04-28 NOTE — Progress Notes (Signed)
PCP is [facility], Dani Gobble, NP Referring Provider is Curt Bears, MD  No chief complaint on file.   HPI: 59 yo woman with a minimal smoking history who presented with right shoulder and arm pain. A MR of the cervical spine revealed a right apical lung mass. She was diagnosed with stage IIIB(T4,N2) non-small cell carcinoma in November of 2015. She was treated with 6 cycles of chemotherapy, carboplatin and paclitaxel + 60 Gy of radiation. She was unhappy with her care in [place] so came to Dr. Julien Nordmann earlier this year. She had a second round of chemotherapy with carboplatin and Alimta. She recently was rescanned and the scan showed a partial response.  She continues to have right shoulder pain that radiates down the right arm. She has gained 10 pounds in the last 3 months. She walks about a mile and a half a day. She does get short of breath with the walking. She uses BIPAP at night. She has not had any unusual headaches or visual changes. She requested to see a surgeon to see if surgery was an option.  Zubrod Score: At the time of surgery this patient's most appropriate activity status/level should be described as: '[]'$     0    Normal activity, no symptoms '[x]'$     1    Restricted in physical strenuous activity but ambulatory, able to do out light work '[]'$     2    Ambulatory and capable of self care, unable to do work activities, up and about >50 % of waking hours                              '[]'$     3    Only limited self care, in bed greater than 50% of waking hours '[]'$     4    Completely disabled, no self care, confined to bed or chair '[]'$     5    Moribund   Past Medical History  Diagnosis Date  . Seasonal allergies   . Pneumothorax     at 60 year old  . Arthritis     Past Surgical History  Procedure Laterality Date  . Cesarean section    . Spine surgery    . Tubal ligation    . Orif ankle fracture      Family History  Problem Relation Age of Onset  . Cancer Mother   . Sudden  death Father   . Hypertension Mother   . Hypertension Brother     Social History History  Substance Use Topics  . Smoking status: Former Smoker -- 1.00 packs/day for 4 years    Quit date: 01/12/1980  . Smokeless tobacco: Not on file  . Alcohol Use: No    Current Outpatient Prescriptions  Medication Sig Dispense Refill  . dexamethasone (DECADRON) 4 MG tablet TAKE ONE TABLET BY MOUTH TWICE A DAY THE DAY BEFORE, THE DAY OF, AND THE DAY AFTER CHEMOTHERAPY EVERY 3 WEEKS (Patient not taking: Reported on 04/11/2015) 40 tablet 0  . folic acid (FOLVITE) 1 MG tablet TAKE ONE TABLET BY MOUTH DAILY START 1 WEEK BEFORE FIRST CHEMOTHERAPY 30 tablet 1  . gabapentin (NEURONTIN) 300 MG capsule Take 300 mg by mouth 2 (two) times daily.    Marland Kitchen lidocaine-prilocaine (EMLA) cream Apply 1 application topically as needed. Apply to portacath 1 1/2 hours - 2 hours prior to procedures as needed. 30 g 1  . montelukast (SINGULAIR) 10  MG tablet Take 10 mg by mouth at bedtime.    . ondansetron (ZOFRAN) 4 MG tablet Take 4 mg by mouth every 8 (eight) hours as needed.    . venlafaxine (EFFEXOR) 75 MG tablet Take 75 mg by mouth daily.     No current facility-administered medications for this visit.    Allergies  Allergen Reactions  . Percocet [Oxycodone-Acetaminophen] Rash    Review of Systems  Constitutional: Positive for fatigue and unexpected weight change (has gained 10 pounds in 3 months). Negative for fever, chills and appetite change.  Respiratory: Positive for apnea (BIPAP at night) and shortness of breath (with exertion, walks a mile and a half). Negative for cough and wheezing.   Gastrointestinal: Negative for abdominal pain and blood in stool.  Genitourinary: Negative for dysuria and hematuria.  Musculoskeletal: Positive for back pain, arthralgias (right shoulder pain) and neck pain.  Neurological: Positive for dizziness and numbness.  Hematological: Negative for adenopathy. Does not bruise/bleed easily.   Psychiatric/Behavioral: The patient is nervous/anxious.   All other systems reviewed and are negative.   There were no vitals taken for this visit. Physical Exam  Constitutional: She is oriented to person, place, and time. She appears well-developed. No distress.  obese  HENT:  Head: Normocephalic and atraumatic.  Eyes: Conjunctivae and EOM are normal. Pupils are equal, round, and reactive to light. No scleral icterus.  Neck: Neck supple. No thyromegaly present.  Cardiovascular: Normal rate, regular rhythm and normal heart sounds.  Exam reveals no gallop and no friction rub.   No murmur heard. Pulmonary/Chest: Effort normal and breath sounds normal. She has no wheezes. She has no rales.  Abdominal: Soft. There is no tenderness.  Musculoskeletal: She exhibits no edema.  Lymphadenopathy:    She has no cervical adenopathy.  Neurological: She is alert and oriented to person, place, and time. No cranial nerve deficit.  No focal deficit  Skin: Skin is warm and dry.  Vitals reviewed.    Diagnostic Tests: CT CHEST WITH CONTRAST  TECHNIQUE: Multidetector CT imaging of the chest was performed during intravenous contrast administration.  CONTRAST: 52m OMNIPAQUE IOHEXOL 300 MG/ML SOLN  COMPARISON: PET 01/26/2015.  FINDINGS: Mediastinum/Nodes: Left IJ Port-A-Cath terminates at the SVC RA junction. A necrotic appearing low right paratracheal lymph node measures 1.4 cm in short axis, stable in size. No hilar or axillary adenopathy. Heart size normal. Coronary artery calcification. No pericardial effusion.  Lungs/Pleura: Apical segment right upper lobe mass measures 3.3 x 4.1 cm (previously 3.4 x 5.0 cm). Surrounding volume loss and ground-glass the right upper lobe. A separate nodular component along the minor fissure measures 9 x 13 mm (series 5, image 14), stable. Nodular densities along the posterior margin of the minor fissure (series 5, images 17 and 18) appear new.  Left lung is clear. No pleural fluid. Airway is unremarkable.  Upper abdomen: Low-attenuation lesions in the liver measure up to 4.1 cm, stable. Visualized portions of the liver, gallbladder, adrenal glands, kidneys, spleen, pancreas, stomach and bowel are otherwise grossly unremarkable. No upper abdominal adenopathy.  Musculoskeletal: No worrisome lytic or sclerotic lesions.  IMPRESSION: 1. Slight interval decrease in size of apical segment right upper lobe mass with stable right paratracheal adenopathy. 2. Right upper lobe nodule, along the minor fissure, stable. 3. Slight nodularity along the posterior margin of the minor fissure appears new. Attention on follow-up exam is warranted. 4. Coronary artery calcification.   Electronically Signed  By: MLorin PicketM.D.  On: 04/11/2015 09:50 I  personally reviewed the CT and her previous PET CT and concur with the findings noted above.  Impression:  Mrs. Judith Gilbert is a 59 yo woman with a stage IIIB(T4,N2) non small cell cancer of the right upper lobe with invasion into the apical chest wall and involvement of the brachial plexus. She has had a good partial response to chemo and radiation followed by consolidation chemotherapy.   I discussed with Mrs. Bibian and her husband that there is no role for surgery in the management of her tumor given that it is stage IIIB.  The procedure would carry tremendous morbidity with no realistic benefit.  She has had a favorable response to therapy, so there is hope.  Plan: She will follow up with Dr. Julien Nordmann as scheduled.  Melrose Nakayama, MD [facility] [phone]
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
## 2015-05-04 ENCOUNTER — Encounter: Payer: Self-pay | Admitting: *Deleted

## 2015-05-04 NOTE — Progress Notes (Signed)
Oncology Nurse Navigator Documentation  Oncology Nurse Navigator Flowsheets 05/04/2015  Navigator Encounter Type Other/Patient called.  She was concerned that records from Dr. Julien Nordmann have not been sent to [facility].  I called Dr. Catalina Gravel office and asked about records I sent on 03/24/15.  I was told they were not in the computer.  I asked for another fax number to sent.  I faxed records to [phone] per their office request.   Treatment Phase Treatment  Barriers/Navigation Needs Family concerns  Interventions Other  Time Spent with Patient 30

## 2015-05-12 ENCOUNTER — Other Ambulatory Visit: Payer: Self-pay | Admitting: Internal Medicine

## 2015-06-08 ENCOUNTER — Other Ambulatory Visit: Payer: Self-pay | Admitting: Internal Medicine

## 2015-06-13 ENCOUNTER — Encounter: Payer: Self-pay | Admitting: *Deleted

## 2015-06-13 ENCOUNTER — Telehealth: Payer: Self-pay | Admitting: Internal Medicine

## 2015-06-13 DIAGNOSIS — R918 Other nonspecific abnormal finding of lung field: Secondary | ICD-10-CM

## 2015-06-13 NOTE — Telephone Encounter (Signed)
Added appt per pof...per orders pof pt aware °

## 2015-06-13 NOTE — Progress Notes (Signed)
Oncology Nurse Navigator Documentation  Oncology Nurse Navigator Flowsheets 06/13/2015  Navigator Encounter Type Telephone/patient called.  She is unable to make appt on 07/05/15.  I called radiology to change appt.  Patient is scheduled for labs at 11:00, CT at 11:30 and see Dr. Julien Nordmann at 1:45.  I called patient back.  She verbalized understanding of appt change.    Treatment Phase -  Barriers/Navigation Needs -  Interventions Coordination of Care  Coordination of Care Radiology  Time Spent with Patient 30

## 2015-06-24 ENCOUNTER — Telehealth: Payer: Self-pay | Admitting: *Deleted

## 2015-06-24 NOTE — Telephone Encounter (Signed)
TC from patient . She states that she has developed migraine -type headaches with visual changes.Judith Gilbert a[place]. She has seen her eye doctor and she was told she had significant changes in her vision. New prescription for glasses was given to pt.  She was also told to monitor visual changes with new lenses. She is concerned it might be d/t spread of the cancer. Also she states her peripheral neuropathy in her legs and feet has gotten worse-feels like pins and needles when she walks.  Currently she is on Gabapentin 300 mg 3 x a day through her orthopedist. Pt states she wanted Dr. Julien Nordmann to know about these symptoms. Her next appt is 07/12/15 with Dr. Julien Nordmann.

## 2015-06-24 NOTE — Telephone Encounter (Signed)
She needs repeat MRI of her Brain. She can do it in [place] or I will be happy to order it to be done here if she likes.

## 2015-06-27 NOTE — Telephone Encounter (Signed)
I left message for pt to call back to f/u migraines.

## 2015-06-29 ENCOUNTER — Telehealth: Payer: Self-pay | Admitting: Internal Medicine

## 2015-06-29 NOTE — Telephone Encounter (Signed)
Patient scheduled for lab/ct/MM same day 9/27. Due to PM PAL 9/27 -  Lab/ct moved to 9 am and 10 am and MM moved to 11:45 am. Spoke with patient she is aware and ok with the change and was given new times for lab/ct and MM on 9/27.

## 2015-07-01 ENCOUNTER — Telehealth: Payer: Self-pay | Admitting: *Deleted

## 2015-07-01 NOTE — Telephone Encounter (Signed)
[employer] w/ [facility] in [place] called requesting order for MRI Brain with and without contrast be faxed to [phone].  Scanned patient yesterday for MRI brain that Tech changed is why a new order is needed."  Return number [phone].

## 2015-07-05 ENCOUNTER — Ambulatory Visit: Payer: BLUE CROSS/BLUE SHIELD | Admitting: Internal Medicine

## 2015-07-05 ENCOUNTER — Other Ambulatory Visit: Payer: BLUE CROSS/BLUE SHIELD

## 2015-07-05 ENCOUNTER — Ambulatory Visit (HOSPITAL_COMMUNITY): Payer: BLUE CROSS/BLUE SHIELD

## 2015-07-12 ENCOUNTER — Encounter: Payer: Self-pay | Admitting: Internal Medicine

## 2015-07-12 ENCOUNTER — Ambulatory Visit (HOSPITAL_BASED_OUTPATIENT_CLINIC_OR_DEPARTMENT_OTHER): Payer: BLUE CROSS/BLUE SHIELD | Admitting: Internal Medicine

## 2015-07-12 ENCOUNTER — Ambulatory Visit (HOSPITAL_COMMUNITY)
Admission: RE | Admit: 2015-07-12 | Discharge: 2015-07-12 | Disposition: A | Discharge status: Home / Self Care | Payer: BLUE CROSS/BLUE SHIELD | Source: Ambulatory Visit | Attending: Internal Medicine | Admitting: Internal Medicine

## 2015-07-12 ENCOUNTER — Other Ambulatory Visit (HOSPITAL_BASED_OUTPATIENT_CLINIC_OR_DEPARTMENT_OTHER): Payer: BLUE CROSS/BLUE SHIELD

## 2015-07-12 ENCOUNTER — Ambulatory Visit (HOSPITAL_COMMUNITY): Payer: BLUE CROSS/BLUE SHIELD

## 2015-07-12 ENCOUNTER — Telehealth: Payer: Self-pay | Admitting: Internal Medicine

## 2015-07-12 VITALS — BP 123/73 | HR 73 | Temp 98.6°F | Resp 17 | Ht 65.0 in | Wt 187.7 lb

## 2015-07-12 DIAGNOSIS — C3411 Malignant neoplasm of upper lobe, right bronchus or lung: Secondary | ICD-10-CM | POA: Diagnosis not present

## 2015-07-12 DIAGNOSIS — I2510 Atherosclerotic heart disease of native coronary artery without angina pectoris: Secondary | ICD-10-CM | POA: Insufficient documentation

## 2015-07-12 DIAGNOSIS — R911 Solitary pulmonary nodule: Secondary | ICD-10-CM | POA: Diagnosis not present

## 2015-07-12 DIAGNOSIS — R599 Enlarged lymph nodes, unspecified: Secondary | ICD-10-CM | POA: Insufficient documentation

## 2015-07-12 DIAGNOSIS — R918 Other nonspecific abnormal finding of lung field: Secondary | ICD-10-CM | POA: Diagnosis not present

## 2015-07-12 LAB — COMPREHENSIVE METABOLIC PANEL (CC13)
ALBUMIN: 3.9 g/dL (ref 3.5–5.0)
ALK PHOS: 123 U/L (ref 40–150)
ALT: 14 U/L (ref 0–55)
AST: 16 U/L (ref 5–34)
Anion Gap: 9 mEq/L (ref 3–11)
BUN: 10.9 mg/dL (ref 7.0–26.0)
CALCIUM: 9.6 mg/dL (ref 8.4–10.4)
CO2: 27 mEq/L (ref 22–29)
Chloride: 106 mEq/L (ref 98–109)
Creatinine: 0.8 mg/dL (ref 0.6–1.1)
EGFR: 80 mL/min/{1.73_m2} — AB (ref >90)
Glucose: 90 mg/dl (ref 70–140)
POTASSIUM: 4.2 meq/L (ref 3.5–5.1)
Sodium: 142 mEq/L (ref 136–145)
Total Bilirubin: 0.31 mg/dL (ref 0.20–1.20)
Total Protein: 7.5 g/dL (ref 6.4–8.3)

## 2015-07-12 LAB — CBC WITH DIFFERENTIAL/PLATELET
BASO%: 0.5 % (ref 0.0–2.0)
BASOS ABS: 0 10*3/uL (ref 0.0–0.1)
EOS ABS: 0.1 10*3/uL (ref 0.0–0.5)
EOS%: 1.5 % (ref 0.0–7.0)
HEMATOCRIT: 36.9 % (ref 34.8–46.6)
HEMOGLOBIN: 12.2 g/dL (ref 11.6–15.9)
LYMPH%: 12.2 % — ABNORMAL LOW (ref 14.0–49.7)
MCH: 30.3 pg (ref 25.1–34.0)
MCHC: 33.1 g/dL (ref 31.5–36.0)
MCV: 91.5 fL (ref 79.5–101.0)
MONO#: 0.3 10*3/uL (ref 0.1–0.9)
MONO%: 7.7 % (ref 0.0–14.0)
NEUT#: 3.4 10*3/uL (ref 1.5–6.5)
NEUT%: 78.1 % — ABNORMAL HIGH (ref 38.4–76.8)
Platelets: 265 10*3/uL (ref 145–400)
RBC: 4.03 10*6/uL (ref 3.70–5.45)
RDW: 14.1 % (ref 11.2–14.5)
WBC: 4.3 10*3/uL (ref 3.9–10.3)
lymph#: 0.5 10*3/uL — ABNORMAL LOW (ref 0.9–3.3)

## 2015-07-12 MED ORDER — IOHEXOL 300 MG/ML  SOLN
100.0000 mL | Freq: Once | INTRAMUSCULAR | Status: AC | PRN
  Administered 2015-07-12: 100 mL via INTRAVENOUS

## 2015-07-12 NOTE — Telephone Encounter (Signed)
per pof to sch pt appt-gave pt copy of avs °

## 2015-07-12 NOTE — Progress Notes (Signed)
[place] [phone]   Fax:([phone]) [phone]  OFFICE PROGRESS NOTE  Arbutus Leas, NP [id number] [facility] [place] [address]  DIAGNOSIS: Stage IIIB (T4, N2, M0) non-small cell lung cancer with negative EGFR mutation, negative ALK gene translocation and negative ROS 1 gene rearrangement diagnosed in November 2015.  PRIOR THERAPY:  1) Status post a course of concurrent chemoradiation with weekly carboplatin and paclitaxel with no significant change in her disease. This treatment was given in [place], [place]. 2) Consolidation systemic chemotherapy with carboplatin for AUC of 5 and Alimta 500 MG/M2 every 3 weeks. Status post 3 cycles. Last cycle was given on 03/09/2015 with partial response.  CURRENT THERAPY: Observation.  INTERVAL HISTORY: Judith Gilbert 59 y.o. female returns to the clinic today for follow-up visit accompanied by her husband.  The patient was evaluated by Dr. Roxan Hockey 2 months ago for consideration of surgical resection but she was not a good candidate for surgical resection because of the persistent mediastinal lymphadenopathy. She has been on observation for the last 3 months. The patient is feeling much better today except for fatigue. She has been complaining of headache and intermittent nausea over the last few weeks and I ordered MRI of the brain performed in [place] that showed no evidence for metastatic disease to the brain.  The patient denied having any significant weight loss or night sweats. She has no nausea or vomiting.  She denied having any significant chest pain, shortness of breath, cough or hemoptysis. She had repeat CT scan of the chest performed earlier today and she is here for evaluation and discussion of her scan results.  MEDICAL HISTORY: Past Medical History  Diagnosis Date  . Seasonal allergies   . Pneumothorax     at 59 year old  . Arthritis     ALLERGIES:  is allergic to  percocet.  MEDICATIONS:  Current Outpatient Prescriptions  Medication Sig Dispense Refill  . ALPRAZolam (XANAX) 0.5 MG tablet     . gabapentin (NEURONTIN) 300 MG capsule Take 300 mg by mouth 2 (two) times daily.    . montelukast (SINGULAIR) 10 MG tablet Take 10 mg by mouth at bedtime.    . ondansetron (ZOFRAN) 4 MG tablet Take 4 mg by mouth every 8 (eight) hours as needed.    Marland Kitchen PREVIDENT 5000 PLUS 1.1 % CREA dental cream     . tiZANidine (ZANAFLEX) 4 MG tablet     . traMADol (ULTRAM) 50 MG tablet     . venlafaxine (EFFEXOR) 75 MG tablet Take 75 mg by mouth daily.    . folic acid (FOLVITE) 1 MG tablet TAKE ONE TABLET BY MOUTH DAILY START 1 WEEK BEFORE FIRST CHEMOTHERAPY (Patient not taking: Reported on 07/12/2015) 30 tablet 1  . lidocaine-prilocaine (EMLA) cream Apply 1 application topically as needed. Apply to portacath 1 1/2 hours - 2 hours prior to procedures as needed. (Patient not taking: Reported on 07/12/2015) 30 g 1   No current facility-administered medications for this visit.    SURGICAL HISTORY:  Past Surgical History  Procedure Laterality Date  . Cesarean section    . Spine surgery    . Tubal ligation    . Orif ankle fracture      REVIEW OF SYSTEMS:  A comprehensive review of systems was negative except for: Constitutional: positive for fatigue   PHYSICAL EXAMINATION: General appearance: alert, cooperative and no distress Head: Normocephalic, without obvious abnormality, atraumatic Neck: no  adenopathy, no JVD, supple, symmetrical, trachea midline and thyroid not enlarged, symmetric, no tenderness/mass/nodules Lymph nodes: Cervical, supraclavicular, and axillary nodes normal. Resp: clear to auscultation bilaterally Back: symmetric, no curvature. ROM normal. No CVA tenderness. Cardio: regular rate and rhythm, S1, S2 normal, no murmur, click, rub or gallop GI: soft, non-tender; bowel sounds normal; no masses,  no organomegaly Extremities: extremities normal, atraumatic, no  cyanosis or edema  ECOG PERFORMANCE STATUS: 1 - Symptomatic but completely ambulatory  Blood pressure 123/73, pulse 73, temperature 98.6 F (37 C), temperature source Oral, resp. rate 17, height 5' 5"  (1.651 m), weight 187 lb 11.2 oz (85.14 kg), SpO2 100 %.  LABORATORY DATA: Lab Results  Component Value Date   WBC 4.3 07/12/2015   HGB 12.2 07/12/2015   HCT 36.9 07/12/2015   MCV 91.5 07/12/2015   PLT 265 07/12/2015      Chemistry      Component Value Date/Time   NA 142 07/12/2015 0926   K 4.2 07/12/2015 0926   CO2 27 07/12/2015 0926   BUN 10.9 07/12/2015 0926   CREATININE 0.8 07/12/2015 0926      Component Value Date/Time   CALCIUM 9.6 07/12/2015 0926   ALKPHOS 123 07/12/2015 0926   AST 16 07/12/2015 0926   ALT 14 07/12/2015 0926   BILITOT 0.31 07/12/2015 0926       RADIOGRAPHIC STUDIES: Ct Chest W Contrast  07/12/2015   CLINICAL DATA:  Right-sided lung cancer diagnosed 11/15. Restaging. Chemotherapy ongoing.  EXAM: CT CHEST WITH CONTRAST  TECHNIQUE: Multidetector CT imaging of the chest was performed during intravenous contrast administration.  CONTRAST:  149m OMNIPAQUE IOHEXOL 300 MG/ML  SOLN  COMPARISON:  04/11/2015 and PET of 01/26/2015.  FINDINGS: Mediastinum/Nodes: Left-sided Port-A-Cath which terminates at the superior caval/ atrial junction. No supraclavicular adenopathy. Heart size upper normal, without pericardial effusion. LAD coronary artery atherosclerosis, including on image 28. No central pulmonary embolism, on this non-dedicated study.  Right paratracheal node measures 1.3 cm on image 20 versus 1.5 cm on the prior exam (when remeasured). No hilar adenopathy.  Lungs/Pleura: No pleural fluid. Right apical lung mass measures 2.7 x 3.9 cm on image 8. Compare 3.3 x 4.1 cm on the prior.  Patchy adjacent right apical interstitial opacity is similar. A more nodular residual component in the right upper lobe measures 1.2 x 1.0 cm on image 13. Compare 1.3 x 0.9 cm on the  prior. Felt to be similar.  Other areas of right upper lobe nodularity described on the prior exam are less distinct and well-defined today.  Clear left lung.  Upper abdomen: Hepatic cysts and a lateral segment left liver lobe too small to characterize lesion, similar. Normal imaged portions of the spleen, stomach, pancreas, gallbladder, adrenal glands, kidneys.  Musculoskeletal: No acute osseous abnormality.  IMPRESSION: 1. Response to therapy. Decreased size of a right apical lung mass and right paratracheal adenopathy. 2. An adjacent right upper lobe pulmonary nodule is similar in size. 3. No new or progressive disease identified. 4. Age advanced coronary artery atherosclerosis. Recommend assessment of coronary risk factors and consideration of medical therapy.   Electronically Signed   By: KAbigail MiyamotoM.D.   On: 07/12/2015 11:14    ASSESSMENT AND PLAN: This is a very pleasant 59years old white female with a stage IIIB non-small cell lung cancer, adenocarcinoma status post a course of concurrent chemotherapy and she completed consolidation chemotherapy with carboplatin and Alimta status post 3 cycles.  repeat MRI of the brain few  weeks ago showed no evidence for metastatic disease to the brain. The CT scan of the chest  Performed earlier today showed  Showed a stable to further improvement in her disease.  I discussed the scan results and showed the images to the patient and her husband.  I recommended for her to continue on observation with repeat CT scan of the chest in 3 months. The patient was advised to call immediately if she has any concerning symptoms in the interval. The patient voices understanding of current disease status and treatment options and is in agreement with the current care plan.  All questions were answered. The patient knows to call the clinic with any problems, questions or concerns. We can certainly see the patient much sooner if necessary.  Disclaimer: This note was  dictated with voice recognition software. Similar sounding words can inadvertently be transcribed and may not be corrected upon review.

## 2015-08-16 DIAGNOSIS — C3490 Malignant neoplasm of unspecified part of unspecified bronchus or lung: Secondary | ICD-10-CM

## 2015-08-16 HISTORY — DX: Malignant neoplasm of unspecified part of unspecified bronchus or lung: C34.90

## 2015-10-13 ENCOUNTER — Ambulatory Visit (HOSPITAL_BASED_OUTPATIENT_CLINIC_OR_DEPARTMENT_OTHER): Payer: BLUE CROSS/BLUE SHIELD | Admitting: Internal Medicine

## 2015-10-13 ENCOUNTER — Encounter: Payer: Self-pay | Admitting: Internal Medicine

## 2015-10-13 ENCOUNTER — Telehealth: Payer: Self-pay | Admitting: Internal Medicine

## 2015-10-13 ENCOUNTER — Ambulatory Visit (HOSPITAL_BASED_OUTPATIENT_CLINIC_OR_DEPARTMENT_OTHER): Payer: BLUE CROSS/BLUE SHIELD

## 2015-10-13 ENCOUNTER — Encounter (HOSPITAL_COMMUNITY): Payer: Self-pay

## 2015-10-13 ENCOUNTER — Ambulatory Visit (HOSPITAL_COMMUNITY)
Admission: RE | Admit: 2015-10-13 | Discharge: 2015-10-13 | Disposition: A | Discharge status: Home / Self Care | Payer: BLUE CROSS/BLUE SHIELD | Source: Ambulatory Visit | Attending: Internal Medicine | Admitting: Internal Medicine

## 2015-10-13 VITALS — BP 115/65 | HR 72 | Temp 97.7°F | Wt 196.8 lb

## 2015-10-13 DIAGNOSIS — I2510 Atherosclerotic heart disease of native coronary artery without angina pectoris: Secondary | ICD-10-CM | POA: Diagnosis not present

## 2015-10-13 DIAGNOSIS — C3411 Malignant neoplasm of upper lobe, right bronchus or lung: Secondary | ICD-10-CM

## 2015-10-13 DIAGNOSIS — R918 Other nonspecific abnormal finding of lung field: Secondary | ICD-10-CM

## 2015-10-13 HISTORY — DX: Malignant neoplasm of unspecified part of unspecified bronchus or lung: C34.90

## 2015-10-13 LAB — COMPREHENSIVE METABOLIC PANEL
ALT: 14 U/L (ref 0–55)
ANION GAP: 8 meq/L (ref 3–11)
AST: 17 U/L (ref 5–34)
Albumin: 3.6 g/dL (ref 3.5–5.0)
Alkaline Phosphatase: 104 U/L (ref 40–150)
BUN: 13.9 mg/dL (ref 7.0–26.0)
CALCIUM: 9.5 mg/dL (ref 8.4–10.4)
CO2: 27 meq/L (ref 22–29)
Chloride: 107 mEq/L (ref 98–109)
Creatinine: 0.8 mg/dL (ref 0.6–1.1)
EGFR: 75 mL/min/{1.73_m2} — AB (ref >90)
Glucose: 100 mg/dl (ref 70–140)
Potassium: 4.6 mEq/L (ref 3.5–5.1)
Sodium: 143 mEq/L (ref 136–145)
TOTAL PROTEIN: 6.9 g/dL (ref 6.4–8.3)

## 2015-10-13 LAB — CBC WITH DIFFERENTIAL/PLATELET
BASO%: 0.5 % (ref 0.0–2.0)
BASOS ABS: 0 10*3/uL (ref 0.0–0.1)
EOS%: 4.6 % (ref 0.0–7.0)
Eosinophils Absolute: 0.2 10*3/uL (ref 0.0–0.5)
HEMATOCRIT: 35.1 % (ref 34.8–46.6)
HEMOGLOBIN: 11.4 g/dL — AB (ref 11.6–15.9)
LYMPH#: 0.8 10*3/uL — AB (ref 0.9–3.3)
LYMPH%: 17.8 % (ref 14.0–49.7)
MCH: 28.7 pg (ref 25.1–34.0)
MCHC: 32.4 g/dL (ref 31.5–36.0)
MCV: 88.7 fL (ref 79.5–101.0)
MONO#: 0.5 10*3/uL (ref 0.1–0.9)
MONO%: 10.7 % (ref 0.0–14.0)
NEUT%: 66.4 % (ref 38.4–76.8)
NEUTROS ABS: 3 10*3/uL (ref 1.5–6.5)
Platelets: 290 10*3/uL (ref 145–400)
RBC: 3.96 10*6/uL (ref 3.70–5.45)
RDW: 15.4 % — AB (ref 11.2–14.5)
WBC: 4.5 10*3/uL (ref 3.9–10.3)

## 2015-10-13 MED ORDER — IOHEXOL 300 MG/ML  SOLN
75.0000 mL | Freq: Once | INTRAMUSCULAR | Status: AC | PRN
  Administered 2015-10-13: 75 mL via INTRAVENOUS

## 2015-10-13 NOTE — Telephone Encounter (Signed)
Gve pt appt for 02/02/16. Advised radiology will call to make ct appt.

## 2015-10-13 NOTE — Progress Notes (Signed)
[place] [phone]   Fax:(336) [phone]  OFFICE PROGRESS NOTE  CARMAC, ELIZABETH B, NP No address on file  DIAGNOSIS: Stage IIIB (T4, N2, M0) non-small cell lung cancer with negative EGFR mutation, negative ALK gene translocation and negative ROS 1 gene rearrangement diagnosed in November 2015.  PRIOR THERAPY:  1) Status post a course of concurrent chemoradiation with weekly carboplatin and paclitaxel with no significant change in her disease. This treatment was given in [place], [place]. 2) Consolidation systemic chemotherapy with carboplatin for AUC of 5 and Alimta 500 MG/M2 every 3 weeks. Status post 3 cycles. Last cycle was given on 03/09/2015 with partial response.  CURRENT THERAPY: Observation.  INTERVAL HISTORY: Dimitri Dsouza 59 y.o. female returns to the clinic today for follow-up visit accompanied by her husband.  The patient is feeling fine today with no specific complaints. She has a good Christmas holiday with her family. She denied having any significant weight loss or night sweats. She has no nausea or vomiting.  She denied having any significant chest pain, shortness of breath, cough or hemoptysis. She had repeat CT scan of the chest performed earlier today and she is here for evaluation and discussion of her scan results.  MEDICAL HISTORY: Past Medical History  Diagnosis Date  . Seasonal allergies   . Pneumothorax     at 59 year old  . Arthritis   . Lung cancer ([place]) 08/2015    ALLERGIES:  is allergic to percocet.  MEDICATIONS:  Current Outpatient Prescriptions  Medication Sig Dispense Refill  . gabapentin (NEURONTIN) 300 MG capsule Take 300 mg by mouth 2 (two) times daily.    . montelukast (SINGULAIR) 10 MG tablet Take 10 mg by mouth at bedtime.    . ondansetron (ZOFRAN) 4 MG tablet Take 4 mg by mouth every 8 (eight) hours as needed.    Marland Kitchen tiZANidine (ZANAFLEX) 4 MG tablet     . venlafaxine (EFFEXOR) 75 MG tablet Take 75 mg by  mouth daily.     No current facility-administered medications for this visit.    SURGICAL HISTORY:  Past Surgical History  Procedure Laterality Date  . Cesarean section    . Spine surgery    . Tubal ligation    . Orif ankle fracture      REVIEW OF SYSTEMS:  A comprehensive review of systems was negative.   PHYSICAL EXAMINATION: General appearance: alert, cooperative and no distress Head: Normocephalic, without obvious abnormality, atraumatic Neck: no adenopathy, no JVD, supple, symmetrical, trachea midline and thyroid not enlarged, symmetric, no tenderness/mass/nodules Lymph nodes: Cervical, supraclavicular, and axillary nodes normal. Resp: clear to auscultation bilaterally Back: symmetric, no curvature. ROM normal. No CVA tenderness. Cardio: regular rate and rhythm, S1, S2 normal, no murmur, click, rub or gallop GI: soft, non-tender; bowel sounds normal; no masses,  no organomegaly Extremities: extremities normal, atraumatic, no cyanosis or edema  ECOG PERFORMANCE STATUS: 1 - Symptomatic but completely ambulatory  There were no vitals taken for this visit.  LABORATORY DATA: Lab Results  Component Value Date   WBC 4.5 10/13/2015   HGB 11.4* 10/13/2015   HCT 35.1 10/13/2015   MCV 88.7 10/13/2015   PLT 290 10/13/2015      Chemistry      Component Value Date/Time   NA 143 10/13/2015 0901   K 4.6 10/13/2015 0901   CO2 27 10/13/2015 0901   BUN 13.9 10/13/2015 0901   CREATININE 0.8 10/13/2015 0901  Component Value Date/Time   CALCIUM 9.5 10/13/2015 0901   ALKPHOS 104 10/13/2015 0901   AST 17 10/13/2015 0901   ALT 14 10/13/2015 0901   BILITOT <0.30 10/13/2015 0901       RADIOGRAPHIC STUDIES: Ct Chest W Contrast  10/13/2015  CLINICAL DATA:  Restaging lung cancer.  Chronic dry cough. EXAM: CT CHEST WITH CONTRAST TECHNIQUE: Multidetector CT imaging of the chest was performed during intravenous contrast administration. CONTRAST:  73m OMNIPAQUE IOHEXOL 300 MG/ML   SOLN COMPARISON:  07/12/2015 FINDINGS: Mediastinum/Nodes: 10 mm lipoma within or along the margin of the lower portion of the right internal jugular vein, image 40 series 602. Right hilar lymph node, 0.8 cm in short axis on image 22 series 2, formerly the same. Right lower paratracheal lymph node 0.8 cm in short axis on image 20 series 2, formerly 1.3 cm by my measurements. Coronary artery atherosclerosis. Lungs/Pleura: Right apical Pancoast tumor measures 3.2 by 2.5 by 3.1 cm, formerly 3.8 by 3.0 by 3.0 cm by my measurements. Adjacent slight pleural thickening at the right lung apex. No rib destruction or well-defined chest wall invasion. Surrounding vague density and a curvilinear rim of volume loss or scarring noted. Previous nodularity along the margin of this process is stable. Upper abdomen: Hypodense hepatic lesions favoring cysts. These were not hypermetabolic on prior chest CT. Musculoskeletal: Unremarkable IMPRESSION: 1. Reduction in size of the right Pancoast tumor and reduction in size of the right lower paratracheal lymph node, both of which were previously hypermetabolic. 2. Stable appearance of bandlike opacity in the right upper lobe, with a stable small nodular component which was previously mildly hypermetabolic. 3. Coronary atherosclerosis. 4. Likely incidental 10 mm lipoma in the lower margin of the right internal jugular vein. This is a very rare finding although has been described in this location in case reports. For example: Intravascular lipoma of the internal jugular vein. J Vasc Surg Venous Lymphat Disord. 2013 Oct;1(4):406-8. Electronically Signed   By: WVan ClinesM.D.   On: 10/13/2015 12:01   ASSESSMENT AND PLAN: This is a very pleasant 59years old white female with a stage IIIB non-small cell lung cancer, adenocarcinoma status post a course of concurrent chemotherapy and she completed consolidation chemotherapy with carboplatin and Alimta status post 3 cycles. The CT scan  of the chest  Performed earlier today showed further improvement in her disease with decrease in the size of the right Pancoast tumor as well as reduction in the size of the right lower paratracheal lymph node.  I discussed the scan results and showed the images to the patient and her husband.  I recommended for her to continue on observation with repeat CT scan of the chest in 4 months. The patient was advised to call immediately if she has any concerning symptoms in the interval. The patient voices understanding of current disease status and treatment options and is in agreement with the current care plan.  All questions were answered. The patient knows to call the clinic with any problems, questions or concerns. We can certainly see the patient much sooner if necessary.  Disclaimer: This note was dictated with voice recognition software. Similar sounding words can inadvertently be transcribed and may not be corrected upon review.

## 2016-02-01 ENCOUNTER — Telehealth: Payer: Self-pay | Admitting: *Deleted

## 2016-02-01 ENCOUNTER — Other Ambulatory Visit: Payer: Self-pay | Admitting: *Deleted

## 2016-02-01 DIAGNOSIS — C3411 Malignant neoplasm of upper lobe, right bronchus or lung: Secondary | ICD-10-CM

## 2016-02-01 NOTE — Telephone Encounter (Signed)
Pt is requesting thyroid labs be checked with lab appt on 4/20.

## 2016-02-01 NOTE — Telephone Encounter (Signed)
OK to add TSH

## 2016-02-02 ENCOUNTER — Ambulatory Visit (HOSPITAL_BASED_OUTPATIENT_CLINIC_OR_DEPARTMENT_OTHER): Payer: BLUE CROSS/BLUE SHIELD | Admitting: Internal Medicine

## 2016-02-02 ENCOUNTER — Encounter: Payer: Self-pay | Admitting: Internal Medicine

## 2016-02-02 ENCOUNTER — Ambulatory Visit (HOSPITAL_COMMUNITY)
Admission: RE | Admit: 2016-02-02 | Discharge: 2016-02-02 | Disposition: A | Discharge status: Home / Self Care | Payer: BLUE CROSS/BLUE SHIELD | Source: Ambulatory Visit | Attending: Internal Medicine | Admitting: Internal Medicine

## 2016-02-02 ENCOUNTER — Telehealth: Payer: Self-pay | Admitting: Internal Medicine

## 2016-02-02 ENCOUNTER — Other Ambulatory Visit (HOSPITAL_BASED_OUTPATIENT_CLINIC_OR_DEPARTMENT_OTHER): Payer: BLUE CROSS/BLUE SHIELD

## 2016-02-02 VITALS — BP 119/62 | HR 75 | Temp 98.2°F | Resp 18 | Ht 65.0 in | Wt 190.7 lb

## 2016-02-02 DIAGNOSIS — C3411 Malignant neoplasm of upper lobe, right bronchus or lung: Secondary | ICD-10-CM | POA: Diagnosis not present

## 2016-02-02 DIAGNOSIS — I2510 Atherosclerotic heart disease of native coronary artery without angina pectoris: Secondary | ICD-10-CM | POA: Insufficient documentation

## 2016-02-02 DIAGNOSIS — Z923 Personal history of irradiation: Secondary | ICD-10-CM | POA: Diagnosis not present

## 2016-02-02 DIAGNOSIS — R918 Other nonspecific abnormal finding of lung field: Secondary | ICD-10-CM | POA: Diagnosis not present

## 2016-02-02 DIAGNOSIS — R5382 Chronic fatigue, unspecified: Secondary | ICD-10-CM

## 2016-02-02 DIAGNOSIS — I7090 Unspecified atherosclerosis: Secondary | ICD-10-CM | POA: Insufficient documentation

## 2016-02-02 DIAGNOSIS — Z85118 Personal history of other malignant neoplasm of bronchus and lung: Secondary | ICD-10-CM

## 2016-02-02 LAB — CBC WITH DIFFERENTIAL/PLATELET
BASO%: 0.4 % (ref 0.0–2.0)
BASOS ABS: 0 10*3/uL (ref 0.0–0.1)
EOS%: 2.8 % (ref 0.0–7.0)
Eosinophils Absolute: 0.1 10*3/uL (ref 0.0–0.5)
HEMATOCRIT: 38.4 % (ref 34.8–46.6)
HEMOGLOBIN: 12.7 g/dL (ref 11.6–15.9)
LYMPH#: 1 10*3/uL (ref 0.9–3.3)
LYMPH%: 21 % (ref 14.0–49.7)
MCH: 29.1 pg (ref 25.1–34.0)
MCHC: 33.1 g/dL (ref 31.5–36.0)
MCV: 88.1 fL (ref 79.5–101.0)
MONO#: 0.5 10*3/uL (ref 0.1–0.9)
MONO%: 9.7 % (ref 0.0–14.0)
NEUT#: 3.1 10*3/uL (ref 1.5–6.5)
NEUT%: 66.1 % (ref 38.4–76.8)
PLATELETS: 235 10*3/uL (ref 145–400)
RBC: 4.36 10*6/uL (ref 3.70–5.45)
RDW: 14.3 % (ref 11.2–14.5)
WBC: 4.7 10*3/uL (ref 3.9–10.3)

## 2016-02-02 LAB — COMPREHENSIVE METABOLIC PANEL
ALBUMIN: 3.6 g/dL (ref 3.5–5.0)
ALK PHOS: 99 U/L (ref 40–150)
ALT: 13 U/L (ref 0–55)
ANION GAP: 10 meq/L (ref 3–11)
AST: 15 U/L (ref 5–34)
BUN: 13.9 mg/dL (ref 7.0–26.0)
CALCIUM: 9.7 mg/dL (ref 8.4–10.4)
CO2: 25 mEq/L (ref 22–29)
CREATININE: 0.8 mg/dL (ref 0.6–1.1)
Chloride: 108 mEq/L (ref 98–109)
EGFR: 76 mL/min/{1.73_m2} — ABNORMAL LOW (ref >90)
Glucose: 94 mg/dl (ref 70–140)
POTASSIUM: 4.6 meq/L (ref 3.5–5.1)
Sodium: 143 mEq/L (ref 136–145)
Total Bilirubin: 0.41 mg/dL (ref 0.20–1.20)
Total Protein: 7.1 g/dL (ref 6.4–8.3)

## 2016-02-02 LAB — TSH: TSH: 2.811 m(IU)/L (ref 0.308–3.960)

## 2016-02-02 MED ORDER — IOHEXOL 300 MG/ML  SOLN
75.0000 mL | Freq: Once | INTRAMUSCULAR | Status: AC | PRN
  Administered 2016-02-02: 75 mL via INTRAVENOUS

## 2016-02-02 NOTE — Progress Notes (Signed)
[place] [phone]) [phone]   Fax:(336) [phone]  OFFICE PROGRESS NOTE  CARMAC, ELIZABETH B, NP No address on file  DIAGNOSIS: Stage IIIB (T4, N2, M0) non-small cell lung cancer with negative EGFR mutation, negative ALK gene translocation and negative ROS 1 gene rearrangement diagnosed in November 2015.  PRIOR THERAPY:  1) Status post a course of concurrent chemoradiation with weekly carboplatin and paclitaxel with no significant change in her disease. This treatment was given in [place], [place]. 2) Consolidation systemic chemotherapy with carboplatin for AUC of 5 and Alimta 500 MG/M2 every 3 weeks. Status post 3 cycles. Last cycle was given on 03/09/2015 with partial response.  CURRENT THERAPY: Observation.  INTERVAL HISTORY: Judith Gilbert 60 y.o. female returns to the clinic today for follow-up visit accompanied by her husband. She has been on observation for almost a year. The patient is feeling fine today with no specific complaints. She denied having any significant weight loss or night sweats. She has no nausea or vomiting.  She denied having any significant chest pain, shortness of breath, cough or hemoptysis. She had repeat CT scan of the chest performed earlier today and she is here for evaluation and discussion of her scan results.  MEDICAL HISTORY: Past Medical History  Diagnosis Date  . Seasonal allergies   . Pneumothorax     at 60 year old  . Arthritis   . Lung cancer ([place]) 08/2015    ALLERGIES:  is allergic to percocet.  MEDICATIONS:  Current Outpatient Prescriptions  Medication Sig Dispense Refill  . gabapentin (NEURONTIN) 300 MG capsule Take 300 mg by mouth 2 (two) times daily.    . montelukast (SINGULAIR) 10 MG tablet Take 10 mg by mouth at bedtime.    . ondansetron (ZOFRAN) 4 MG tablet Take 4 mg by mouth every 8 (eight) hours as needed.    Marland Kitchen tiZANidine (ZANAFLEX) 4 MG tablet     . venlafaxine (EFFEXOR) 75 MG tablet Take 75 mg by mouth  daily.     No current facility-administered medications for this visit.    SURGICAL HISTORY:  Past Surgical History  Procedure Laterality Date  . Cesarean section    . Spine surgery    . Tubal ligation    . Orif ankle fracture      REVIEW OF SYSTEMS:  A comprehensive review of systems was negative.   PHYSICAL EXAMINATION: General appearance: alert, cooperative and no distress Head: Normocephalic, without obvious abnormality, atraumatic Neck: no adenopathy, no JVD, supple, symmetrical, trachea midline and thyroid not enlarged, symmetric, no tenderness/mass/nodules Lymph nodes: Cervical, supraclavicular, and axillary nodes normal. Resp: clear to auscultation bilaterally Back: symmetric, no curvature. ROM normal. No CVA tenderness. Cardio: regular rate and rhythm, S1, S2 normal, no murmur, click, rub or gallop GI: soft, non-tender; bowel sounds normal; no masses,  no organomegaly Extremities: extremities normal, atraumatic, no cyanosis or edema  ECOG PERFORMANCE STATUS: 1 - Symptomatic but completely ambulatory  There were no vitals taken for this visit.  LABORATORY DATA: Lab Results  Component Value Date   WBC 4.7 02/02/2016   HGB 12.7 02/02/2016   HCT 38.4 02/02/2016   MCV 88.1 02/02/2016   PLT 235 02/02/2016      Chemistry      Component Value Date/Time   NA 143 02/02/2016 0853   K 4.6 02/02/2016 0853   CO2 25 02/02/2016 0853   BUN 13.9 02/02/2016 0853   CREATININE 0.8 02/02/2016 0853      Component  Value Date/Time   CALCIUM 9.7 02/02/2016 0853   ALKPHOS 99 02/02/2016 0853   AST 15 02/02/2016 0853   ALT 13 02/02/2016 0853   BILITOT 0.41 02/02/2016 0853       RADIOGRAPHIC STUDIES: Ct Chest W Contrast  02/02/2016  CLINICAL DATA:  Right-sided lung cancer diagnosed in 2015. Hysterectomy. Last chemotherapy in 2016. Restaging. EXAM: CT CHEST WITH CONTRAST TECHNIQUE: Multidetector CT imaging of the chest was performed during intravenous contrast administration.  CONTRAST:  52m OMNIPAQUE IOHEXOL 300 MG/ML  SOLN COMPARISON:  10/13/2015 FINDINGS: Mediastinum/Nodes: Left Port-A-Cath which terminates at the low SVC. Right internal jugular vein lipoma again identified, including at 9 mm on image 13/ series 2. No supraclavicular adenopathy. Normal heart size, without pericardial effusion. No central pulmonary embolism, on this non-dedicated study. LAD coronary artery atherosclerosis. A right paratracheal node measures 8 mm on image 34/series 2 and is unchanged. No hilar adenopathy. Lungs/Pleura: No pleural fluid. Right apical/ Pancoast tumor again identified. This measures 2.8 x 2.8 cm on image 14/series 2 transverse. Compare 3.0 x 3.0 cm on the prior exam (when remeasured). On coronal image 67, measures 3.1 x 2.7 cm today versus 3.2 x 2.5 cm on the prior. On sagittal image 109, 3.3 x 2.2 cm today versus 3.1 x 2.2 on the prior. Findings overall are most consistent with stable disease. 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 The most nodular area of consolidation measures on the order of 11 mm on image 30/series 5. This is more laterally positioned compared to 12 mm nodular area of measured on the prior. Upper abdomen: Probable mild hepatic steatosis. Left hepatic lobe cyst. Other too small to characterize liver lesions which are unchanged. Normal imaged portions of the spleen, pancreas, gallbladder, adrenal glands, kidneys. Musculoskeletal: No acute osseous abnormality.  No rib destruction. IMPRESSION: 1. Similar size of a right apical/Pancoast tumor. 2. Similar right paratracheal node which is not pathologic by size criteria but was hypermetabolic on prior PET. 3. No new or progressive disease. 4. 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 5.  Atherosclerosis, including within the coronary arteries. Electronically Signed   By: KAbigail MiyamotoM.D.   On: 02/02/2016 10:29   ASSESSMENT AND PLAN: This is a very pleasant 60years  old white female with a stage IIIB non-small cell lung cancer, adenocarcinoma status post a course of concurrent chemotherapy and she completed consolidation chemotherapy with carboplatin and Alimta status post 3 cycles. The CT scan of the chest showed no evidence for disease progression. I discussed the scan results and showed the images to the patient and her husband. I recommended for her to continue on observation with repeat CT scan of the chest in 4 months. The patient was advised to call immediately if she has any concerning symptoms in the interval. The patient voices understanding of current disease status and treatment options and is in agreement with the current care plan.  All questions were answered. The patient knows to call the clinic with any problems, questions or concerns. We can certainly see the patient much sooner if necessary.  Disclaimer: This note was dictated with voice recognition software. Similar sounding words can inadvertently be transcribed and may not be corrected upon review.

## 2016-02-02 NOTE — Telephone Encounter (Signed)
per pof to sch pt appt-adv central sch willc all to sch trmt-gave pt avs

## 2016-02-12 ENCOUNTER — Other Ambulatory Visit: Payer: Self-pay | Admitting: Internal Medicine

## 2016-02-12 DIAGNOSIS — R5382 Chronic fatigue, unspecified: Secondary | ICD-10-CM | POA: Insufficient documentation

## 2016-05-18 ENCOUNTER — Telehealth: Payer: Self-pay | Admitting: Internal Medicine

## 2016-05-18 ENCOUNTER — Telehealth: Payer: Self-pay | Admitting: Medical Oncology

## 2016-05-18 NOTE — Telephone Encounter (Signed)
Wants port flushed on 8/16 Oncology tx request sent

## 2016-05-18 NOTE — Telephone Encounter (Signed)
Added flush per pof

## 2016-05-30 ENCOUNTER — Ambulatory Visit: Payer: BLUE CROSS/BLUE SHIELD | Admitting: Internal Medicine

## 2016-05-30 ENCOUNTER — Other Ambulatory Visit: Payer: BLUE CROSS/BLUE SHIELD

## 2016-05-30 ENCOUNTER — Ambulatory Visit: Payer: BLUE CROSS/BLUE SHIELD

## 2016-05-30 ENCOUNTER — Telehealth: Payer: Self-pay | Admitting: Medical Oncology

## 2016-05-30 ENCOUNTER — Other Ambulatory Visit: Payer: Self-pay | Admitting: Medical Oncology

## 2016-05-30 ENCOUNTER — Telehealth: Payer: Self-pay | Admitting: Internal Medicine

## 2016-05-30 NOTE — Telephone Encounter (Signed)
SPOKE WITH PATIENT TODAY RE MOVING ALL APPOINTMENTS LAB/CT/FU FROM 8/16 AND 8/17 TO 8/24 FOR LAB/FU/CT. [facility] [place] [facility] AND NEED[place] ALL APPOINTMENTS ON THE SAME DAY. PATIENT HAS NEW DATE/TIME FOR 8/24 APPOINTMENTS. ALL OTHER DATES HAVE BEEN CXD - PATIENT AWARE.

## 2016-05-30 NOTE — Telephone Encounter (Signed)
I left a message for pt to return my call about her appts .

## 2016-05-31 ENCOUNTER — Ambulatory Visit (HOSPITAL_COMMUNITY): Payer: BLUE CROSS/BLUE SHIELD

## 2016-06-05 IMAGING — PT NM PET TUM IMG INITIAL (PI) SKULL BASE T - THIGH
1 of 8 series · 1 of 25 positions shown · non-contrast
Comparison: None available.

CLINICAL DATA: Subsequent treatment strategy for lung cancer.
Chemotherapy and radiation therapy completed. Subsequent encounter.

EXAM:
NUCLEAR MEDICINE PET SKULL BASE TO THIGH
TECHNIQUE: 8.48 mCi F-18 FDG was injected intravenously. Full-ring PET imaging
was performed from the skull base to thigh after the radiotracer. CT
data was obtained and used for attenuation correction and anatomic
localization.
FASTING BLOOD GLUCOSE:  Value: 120 mg/dl

[Series 4: ct sk_thigh 5.0 b31f · axial · 5.0mm · 0.98mm/px · 1 of 220 slices shown]
[im 220/220  brain]
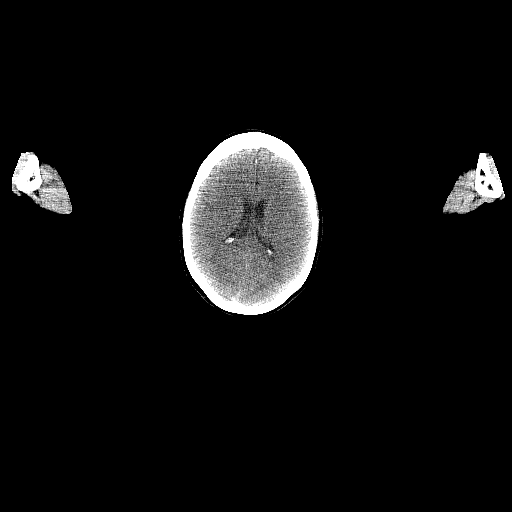

[1 of 25 positions shown; findings below may reference images not displayed]

FINDINGS: NECK

No hypermetabolic cervical lymph nodes are identified.There are no
lesions of the pharyngeal mucosal space. There is physiologic
activity associated with the muscles of phonation. 9 mm right
thyroid nodule on image 47 does not show any increased metabolic
activity.

CHEST

Well-circumscribed right apical mass measures 5.0 x 3.4 cm and is
significantly hypermetabolic with an SUV max of 12.9. This lesion
demonstrates no significant central necrosis. Pleural extension of
this lesion cannot be excluded, although there is no rib
destruction. A spiculated right upper lobe nodule measuring 1.5 cm
on image 16 is also hypermetabolic for size with an SUV max of 3.5.
No other suspicious pulmonary findings. There is a hypermetabolic
right paratracheal node measuring 1.5 cm short axis on image 61.
This has an SUV max of 7.2. No other hypermetabolic nodal activity
identified. Left IJ Port-A-Cath tip in the lower SVC near the SVC
right atrial junction.

ABDOMEN/PELVIS

There is no hypermetabolic activity within the liver, adrenal
glands, spleen or pancreas. There is no hypermetabolic nodal
activity. There are hepatic cysts measuring up to 4.6 cm in the
central left lobe, devoid of metabolic activity. Sigmoid colon
diverticular changes and prior hysterectomy noted.

SKELETON

There is no hypermetabolic activity to suggest osseous metastatic
disease. Right apical mass demonstrates no definite rib destruction.
There is decreased marrow activity within the upper thoracic spine
consistent with prior radiation therapy.
IMPRESSION: 1. Significantly hypermetabolic right apical mass with potential
pleural extension but no rib destruction.
2. Additional spiculated right upper lobe nodule is also
hypermetabolic.
3. Single hypermetabolic right paratracheal lymph node.
4. No evidence of extra thoracic metastatic disease.

## 2016-06-07 ENCOUNTER — Encounter: Payer: Self-pay | Admitting: Internal Medicine

## 2016-06-07 ENCOUNTER — Other Ambulatory Visit (HOSPITAL_BASED_OUTPATIENT_CLINIC_OR_DEPARTMENT_OTHER): Payer: BLUE CROSS/BLUE SHIELD

## 2016-06-07 ENCOUNTER — Ambulatory Visit (HOSPITAL_BASED_OUTPATIENT_CLINIC_OR_DEPARTMENT_OTHER): Payer: BLUE CROSS/BLUE SHIELD | Admitting: Internal Medicine

## 2016-06-07 ENCOUNTER — Ambulatory Visit (HOSPITAL_COMMUNITY)
Admission: RE | Admit: 2016-06-07 | Discharge: 2016-06-07 | Disposition: A | Discharge status: Home / Self Care | Payer: BLUE CROSS/BLUE SHIELD | Source: Ambulatory Visit | Attending: Internal Medicine | Admitting: Internal Medicine

## 2016-06-07 ENCOUNTER — Encounter (HOSPITAL_COMMUNITY): Payer: Self-pay

## 2016-06-07 ENCOUNTER — Ambulatory Visit: Payer: BLUE CROSS/BLUE SHIELD

## 2016-06-07 VITALS — BP 109/54 | HR 70 | Temp 98.5°F | Resp 18 | Ht 65.0 in | Wt 194.3 lb

## 2016-06-07 DIAGNOSIS — R918 Other nonspecific abnormal finding of lung field: Secondary | ICD-10-CM | POA: Insufficient documentation

## 2016-06-07 DIAGNOSIS — I700 Atherosclerosis of aorta: Secondary | ICD-10-CM | POA: Diagnosis not present

## 2016-06-07 DIAGNOSIS — I2510 Atherosclerotic heart disease of native coronary artery without angina pectoris: Secondary | ICD-10-CM | POA: Diagnosis not present

## 2016-06-07 DIAGNOSIS — C3411 Malignant neoplasm of upper lobe, right bronchus or lung: Secondary | ICD-10-CM

## 2016-06-07 LAB — COMPREHENSIVE METABOLIC PANEL
ALT: 13 U/L (ref 0–55)
ANION GAP: 8 meq/L (ref 3–11)
AST: 20 U/L (ref 5–34)
Albumin: 3.5 g/dL (ref 3.5–5.0)
Alkaline Phosphatase: 108 U/L (ref 40–150)
BUN: 19.8 mg/dL (ref 7.0–26.0)
CALCIUM: 9.4 mg/dL (ref 8.4–10.4)
CO2: 24 mEq/L (ref 22–29)
CREATININE: 0.8 mg/dL (ref 0.6–1.1)
Chloride: 109 mEq/L (ref 98–109)
EGFR: 86 mL/min/{1.73_m2} — AB (ref >90)
Glucose: 90 mg/dl (ref 70–140)
Potassium: 4.2 mEq/L (ref 3.5–5.1)
Sodium: 141 mEq/L (ref 136–145)
Total Bilirubin: 0.32 mg/dL (ref 0.20–1.20)
Total Protein: 7 g/dL (ref 6.4–8.3)

## 2016-06-07 LAB — CBC WITH DIFFERENTIAL/PLATELET
BASO%: 0.5 % (ref 0.0–2.0)
BASOS ABS: 0 10*3/uL (ref 0.0–0.1)
EOS ABS: 0.1 10*3/uL (ref 0.0–0.5)
EOS%: 2.3 % (ref 0.0–7.0)
HEMATOCRIT: 34.5 % — AB (ref 34.8–46.6)
HGB: 11.7 g/dL (ref 11.6–15.9)
LYMPH%: 29.4 % (ref 14.0–49.7)
MCH: 29.7 pg (ref 25.1–34.0)
MCHC: 33.9 g/dL (ref 31.5–36.0)
MCV: 87.6 fL (ref 79.5–101.0)
MONO#: 0.4 10*3/uL (ref 0.1–0.9)
MONO%: 8.6 % (ref 0.0–14.0)
NEUT%: 59.2 % (ref 38.4–76.8)
NEUTROS ABS: 2.6 10*3/uL (ref 1.5–6.5)
PLATELETS: 241 10*3/uL (ref 145–400)
RBC: 3.94 10*6/uL (ref 3.70–5.45)
RDW: 14.1 % (ref 11.2–14.5)
WBC: 4.3 10*3/uL (ref 3.9–10.3)
lymph#: 1.3 10*3/uL (ref 0.9–3.3)
nRBC: 0 % (ref 0–0)

## 2016-06-07 MED ORDER — IOPAMIDOL (ISOVUE-300) INJECTION 61%
75.0000 mL | Freq: Once | INTRAVENOUS | Status: AC | PRN
  Administered 2016-06-07: 75 mL via INTRAVENOUS

## 2016-06-07 NOTE — Progress Notes (Signed)
[facility] [phone]   [phone]  OFFICE PROGRESS NOTE  No primary care provider on file. No primary provider on file.  DIAGNOSIS: Stage IIIB (T4, N2, M0) non-small cell lung cancer with negative EGFR mutation, negative ALK gene translocation and negative ROS 1 gene rearrangement diagnosed in November 2015.  PRIOR THERAPY:  1) Status post a course of concurrent chemoradiation with weekly carboplatin and paclitaxel with no significant change in her disease. This treatment was given in [place], [place]. 2) Consolidation systemic chemotherapy with carboplatin for AUC of 5 and Alimta 500 MG/M2 every 3 weeks. Status post 3 cycles. Last cycle was given on 03/09/2015 with partial response.  CURRENT THERAPY: Observation.  INTERVAL HISTORY: Judith Gilbert 60 y.o. female returns to the clinic today for follow-up visit accompanied by her husband. She has been on observation for the last 15 months. The patient is feeling fine today with no specific complaints. She has no significant change since her last visit. She denied having any significant weight loss or night sweats. She has no nausea or vomiting.  She denied having any significant chest pain, shortness of breath, cough or hemoptysis. She had repeat CT scan of the chest performed earlier today and she is here for evaluation and discussion of her scan results.  MEDICAL HISTORY: Past Medical History:  Diagnosis Date  . Arthritis   . Lung cancer ([facility]) 08/2015  . Pneumothorax    at 60 year old  . Seasonal allergies     ALLERGIES:  is allergic to percocet [oxycodone-acetaminophen].  MEDICATIONS:  Current Outpatient Prescriptions  Medication Sig Dispense Refill  . citalopram (CELEXA) 20 MG tablet     . gabapentin (NEURONTIN) 300 MG capsule Take 300 mg by mouth 2 (two) times daily.    . montelukast (SINGULAIR) 10 MG tablet Take 10 mg by mouth at bedtime.     No current facility-administered medications  for this visit.     SURGICAL HISTORY:  Past Surgical History:  Procedure Laterality Date  . CESAREAN SECTION    . ORIF ANKLE FRACTURE    . SPINE SURGERY    . TUBAL LIGATION      REVIEW OF SYSTEMS:  Constitutional: negative Eyes: negative Ears, nose, mouth, throat, and face: negative Respiratory: negative Cardiovascular: negative Gastrointestinal: negative Genitourinary:negative Integument/breast: negative Hematologic/lymphatic: negative Musculoskeletal:negative Neurological: negative Behavioral/Psych: negative Endocrine: negative Allergic/Immunologic: negative   PHYSICAL EXAMINATION: General appearance: alert, cooperative and no distress Head: Normocephalic, without obvious abnormality, atraumatic Neck: no adenopathy, no JVD, supple, symmetrical, trachea midline and thyroid not enlarged, symmetric, no tenderness/mass/nodules Lymph nodes: Cervical, supraclavicular, and axillary nodes normal. Resp: clear to auscultation bilaterally Back: symmetric, no curvature. ROM normal. No CVA tenderness. Cardio: regular rate and rhythm, S1, S2 normal, no murmur, click, rub or gallop GI: soft, non-tender; bowel sounds normal; no masses,  no organomegaly Extremities: extremities normal, atraumatic, no cyanosis or edema Neurologic: Alert and oriented X 3, normal strength and tone. Normal symmetric reflexes. Normal coordination and gait  ECOG PERFORMANCE STATUS: 1 - Symptomatic but completely ambulatory  Blood pressure (!) 109/54, pulse 70, temperature 98.5 F (36.9 C), temperature source Oral, resp. rate 18, height _0  (1.651 m), weight 194 lb 4.8 oz (88.1 kg), SpO2 100 %.  LABORATORY DATA: Lab Results  Component Value Date   WBC 4.3 06/07/2016   HGB 11.7 06/07/2016   HCT 34.5 (L) 06/07/2016   MCV 87.6 06/07/2016   PLT 241 06/07/2016  Chemistry      Component Value Date/Time   NA 141 06/07/2016 0850   K 4.2 06/07/2016 0850   CO2 24 06/07/2016 0850   BUN 19.8 06/07/2016  0850   CREATININE 0.8 06/07/2016 0850      Component Value Date/Time   CALCIUM 9.4 06/07/2016 0850   ALKPHOS 108 06/07/2016 0850   AST 20 06/07/2016 0850   ALT 13 06/07/2016 0850   BILITOT 0.32 06/07/2016 0850       RADIOGRAPHIC STUDIES: Ct Chest W Contrast  Result Date: 06/07/2016 CLINICAL DATA:  Re- stage apical right upper lobe lung cancer status post completion of chemotherapy and radiation therapy in 2016. No acute symptoms are reported. EXAM: CT CHEST WITH CONTRAST TECHNIQUE: Multidetector CT imaging of the chest was performed during intravenous contrast administration. CONTRAST:  34m ISOVUE-300 IOPAMIDOL (ISOVUE-300) INJECTION 61% COMPARISON:  02/02/2016 chest CT. FINDINGS: Mediastinum/Nodes: Normal heart size. No significant pericardial fluid/thickening. Left internal jugular MediPort terminates in the lower third of the superior vena cava. Left anterior descending and right coronary atherosclerosis. Atherosclerotic nonaneurysmal thoracic aorta. Normal caliber pulmonary arteries. No central pulmonary emboli. Hypodense 0.4 cm right thyroid lobe nodule appears decreased from 0.9 cm. Unremarkable esophagus. No axillary adenopathy. No pathologically enlarged mediastinal or hilar nodes. Circumscribed fat density 1.0 cm lesion at the confluence of the right internal jugular vein and right subclavian vein (series 2/image 14), stable. Lungs/Pleura: No pneumothorax. No pleural effusion. Apical right upper lobe 3.4 x 3.0 cm lung mass (series 2/image 17), previously 2.9 x 2.6 cm on 02/02/2016 using similar measurement technique, increased in size. Stable loss of the normal fat plane between the apical right upper lobe lung mass and proximal right subclavian artery. Spiculated right upper lobe 1.4 x 1.2 cm pulmonary nodule (series 5/ image 25), previously 1.3 x 1.1 cm, slightly increased. Solid 5 mm right middle lobe pulmonary nodule (series 5/ image 60), previously 3 mm, increased. No acute  consolidative airspace disease or additional significant pulmonary nodules. Slightly increased reticulation and patchy ground-glass opacity in the medial upper right lung, consistent with evolving postradiation change. Upper abdomen: Simple 4.0 cm left lower lobe cyst. Low-attenuation 0.3 cm lateral segment left liver lobe lesion, too small to characterize, unchanged. Musculoskeletal: No aggressive appearing focal osseous lesions. Mild thoracic spondylosis. IMPRESSION: 1. Primary apical right upper lobe lung malignancy has mildly increased in size. Separate spiculated right upper lobe pulmonary nodule is slightly increased. Evolving postradiation change in the medial upper right lung. 2. Right middle lobe 5 mm pulmonary nodule is increased and suspicious for metastasis. 3. No thoracic adenopathy by size criteria. 4. Aortic atherosclerosis.  Coronary atherosclerosis. Electronically Signed   By: JIlona SorrelM.D.   On: 06/07/2016 11:59   ASSESSMENT AND PLAN: This is a very pleasant 60years old white female with a stage IIIB non-small cell lung cancer, adenocarcinoma status post a course of concurrent chemotherapy and she completed consolidation chemotherapy with carboplatin and Alimta status post 3 cycles. The recent CT scan of the chest showed mild increase in the right apical right upper lobe lung mass as well as slight increase of a separate spiculated right upper lobe pulmonary nodule and right middle lobe 5 mm pulmonary nodule but no significant change in the mediastinal lymph nodes. I discussed the scan results with the patient and her husband. I discussed with the patient several options for treatment of her condition including continuous observation and monitoring with repeat CT scan of the chest in 2-3 months for evaluation  of this mild progression versus consideration of treatment with second line therapy. The patient is interested in proceeding with treatment and I recommended for her consideration of  treatment with immunotherapy with Nivolumab 240 MG IV every 2 weeks. I discussed with the patient adverse effect of this treatment including but not limited to immune mediated skin rash, diarrhea, inflammation of the lung, liver, kidney as well as thyroid or other endocrine dysfunction. She is interested in proceeding with the treatment with immunotherapy but she would like to take it close to home with her local oncologist. I encouraged the patient to contact her local oncologist and I will forward my note to him for consideration of treatment of her condition with immunotherapy. I did not give the patient follow-up appointment but will be happy to see her in the future if needed. The patient was advised to call immediately if she has any concerning symptoms in the interval. The patient voices understanding of current disease status and treatment options and is in agreement with the current care plan.  All questions were answered. The patient knows to call the clinic with any problems, questions or concerns. We can certainly see the patient much sooner if necessary.  Disclaimer: This note was dictated with voice recognition software. Similar sounding words can inadvertently be transcribed and may not be corrected upon review.

## 2016-06-08 ENCOUNTER — Telehealth: Payer: Self-pay | Admitting: *Deleted

## 2016-06-08 NOTE — Telephone Encounter (Signed)
Oncology Nurse Navigator Documentation  Oncology Nurse Navigator Flowsheets 06/08/2016  Navigator Encounter Type Telephone/I called Judith Gilbert to update her I have faxed and called Dr. Catalina Gravel office regarding appt and follow up.  She will be seen today.  She was thankful for the call.    Telephone Outgoing Call  Treatment Phase Other  Barriers/Navigation Needs Coordination of Care  Interventions Coordination of Care  Coordination of Care Appts;Other  Acuity Level 2  Acuity Level 2 Assistance expediting appointments  Time Spent with Patient 45

## 2016-06-20 ENCOUNTER — Other Ambulatory Visit: Payer: Self-pay | Admitting: Internal Medicine

## 2020-04-14 DEATH — deceased
